# Patient Record
Sex: Male | Born: 1985 | Race: Black or African American | Hispanic: No | Marital: Married | Smoking: Current every day smoker
Health system: Southern US, Community
[De-identification: ages and names within clinical notes are randomized; demographics above are authoritative.]

## PROBLEM LIST (undated history)

## (undated) DIAGNOSIS — J45909 Unspecified asthma, uncomplicated: Secondary | ICD-10-CM

---

## 2007-08-06 ENCOUNTER — Other Ambulatory Visit: Payer: Self-pay

## 2007-08-06 ENCOUNTER — Emergency Department: Payer: Self-pay | Admitting: Emergency Medicine

## 2008-01-29 ENCOUNTER — Emergency Department: Payer: Self-pay | Admitting: Emergency Medicine

## 2008-02-06 ENCOUNTER — Other Ambulatory Visit: Payer: Self-pay

## 2008-02-06 ENCOUNTER — Emergency Department: Payer: Self-pay | Admitting: Emergency Medicine

## 2008-07-22 ENCOUNTER — Emergency Department: Payer: Self-pay | Admitting: Emergency Medicine

## 2008-12-04 ENCOUNTER — Emergency Department: Payer: Self-pay | Admitting: Emergency Medicine

## 2009-02-11 ENCOUNTER — Emergency Department: Payer: Self-pay | Admitting: Internal Medicine

## 2010-11-18 ENCOUNTER — Emergency Department: Payer: Self-pay | Admitting: Unknown Physician Specialty

## 2011-06-16 ENCOUNTER — Emergency Department: Payer: Self-pay | Admitting: Emergency Medicine

## 2012-04-10 ENCOUNTER — Emergency Department: Payer: Self-pay | Admitting: Emergency Medicine

## 2012-04-10 LAB — COMPREHENSIVE METABOLIC PANEL
Alkaline Phosphatase: 54 U/L (ref 50–136)
Bilirubin,Total: 0.5 mg/dL (ref 0.2–1.0)
Calcium, Total: 9.1 mg/dL (ref 8.5–10.1)
Chloride: 107 mmol/L (ref 98–107)
Co2: 25 mmol/L (ref 21–32)
Creatinine: 1.07 mg/dL (ref 0.60–1.30)
EGFR (African American): >60
EGFR (Non-African Amer.): >60
Potassium: 3.8 mmol/L (ref 3.5–5.1)
SGOT(AST): 34 U/L (ref 15–37)
SGPT (ALT): 35 U/L
Sodium: 141 mmol/L (ref 136–145)
Total Protein: 8.3 g/dL — ABNORMAL HIGH (ref 6.4–8.2)

## 2012-04-10 LAB — URINALYSIS, COMPLETE
Bilirubin,UR: NEGATIVE
Blood: NEGATIVE
Glucose,UR: NEGATIVE mg/dL (ref 0–75)
Nitrite: NEGATIVE
Ph: 7 (ref 4.5–8.0)
Specific Gravity: 1.03 (ref 1.003–1.030)
WBC UR: 1 /HPF (ref 0–5)

## 2012-04-10 LAB — CBC
HGB: 15 g/dL (ref 13.0–18.0)
MCV: 83 fL (ref 80–100)
Platelet: 196 10*3/uL (ref 150–440)
RBC: 5.62 10*6/uL (ref 4.40–5.90)
WBC: 10.2 10*3/uL (ref 3.8–10.6)

## 2012-07-04 ENCOUNTER — Emergency Department: Payer: Self-pay | Admitting: Emergency Medicine

## 2012-07-17 ENCOUNTER — Emergency Department: Payer: Self-pay | Admitting: Emergency Medicine

## 2013-02-14 ENCOUNTER — Emergency Department: Payer: Self-pay | Admitting: Emergency Medicine

## 2014-11-27 ENCOUNTER — Emergency Department: Payer: Self-pay | Admitting: Student

## 2015-02-14 ENCOUNTER — Emergency Department: Payer: Self-pay | Admitting: Emergency Medicine

## 2016-09-26 ENCOUNTER — Emergency Department
Admission: EM | Admit: 2016-09-26 | Discharge: 2016-09-26 | Disposition: A | Discharge status: Home / Self Care | Payer: Self-pay | Attending: Emergency Medicine | Admitting: Emergency Medicine

## 2016-09-26 ENCOUNTER — Encounter: Payer: Self-pay | Admitting: *Deleted

## 2016-09-26 DIAGNOSIS — X58XXXA Exposure to other specified factors, initial encounter: Secondary | ICD-10-CM | POA: Insufficient documentation

## 2016-09-26 DIAGNOSIS — Y999 Unspecified external cause status: Secondary | ICD-10-CM | POA: Insufficient documentation

## 2016-09-26 DIAGNOSIS — Y929 Unspecified place or not applicable: Secondary | ICD-10-CM | POA: Insufficient documentation

## 2016-09-26 DIAGNOSIS — S0501XA Injury of conjunctiva and corneal abrasion without foreign body, right eye, initial encounter: Secondary | ICD-10-CM | POA: Insufficient documentation

## 2016-09-26 DIAGNOSIS — F17210 Nicotine dependence, cigarettes, uncomplicated: Secondary | ICD-10-CM | POA: Insufficient documentation

## 2016-09-26 DIAGNOSIS — Y9389 Activity, other specified: Secondary | ICD-10-CM | POA: Insufficient documentation

## 2016-09-26 MED ORDER — NAPHAZOLINE-PHENIRAMINE 0.025-0.3 % OP SOLN: 1.0000 [drp] | Freq: Four times a day (QID) | OPHTHALMIC | 0 refills | Status: DC | PRN

## 2016-09-26 NOTE — ED Notes (Signed)
See triage note  States he developed some irritation and drainage to right eye yesterday  States he put a couple of drops of lemon juice in eye  Min redness noted at present no swelling

## 2016-09-26 NOTE — ED Provider Notes (Signed)
[facility] Emergency Department Provider Note   ____________________________________________   None    (approximate)  I have reviewed the triage vital signs and the nursing notes.   HISTORY  Chief Complaint Eye Drainage    HPI Henry Jackson is a 30 y.o. male patient complaining of drainage from right eye yesterday. Patient was told to go home by manager to get medical clearance for returning to work. Patient works in W[employer]. Patient state this morning he put a couple drops of limited juice in his eye because he was told this would decrease the irritation. Patient states still has some mild burning but no further tearing. Patient rates his pain as a 2/10. History reviewed. No pertinent past medical history.  There are no active problems to display for this patient.   History reviewed. No pertinent surgical history.  Prior to Admission medications   Medication Sig Start Date End Date Taking? Authorizing Provider  naphazoline-pheniramine (NAPHCON-A) 0.025-0.3 % ophthalmic solution Place 1 drop into both eyes 4 (four) times daily as needed for irritation. 09/26/16   Joni Reining, PA-C    Allergies Penicillins and Sulfa antibiotics  No family history on file.  Social History Social History  Substance Use Topics  . Smoking status: Current Every Day Smoker    Packs/day: 1.00    Types: Cigarettes  . Smokeless tobacco: Not on file  . Alcohol use No    Review of Systems Constitutional: No fever/chills Eyes: No visual changes. ENT: No sore throat. Cardiovascular: Denies chest pain. Respiratory: Denies shortness of breath. Gastrointestinal: No abdominal pain.  No nausea, no vomiting.  No diarrhea.  No constipation. Genitourinary: Negative for dysuria. Musculoskeletal: Negative for back pain. Skin: Negative for rash. Neurological: Negative for headaches, focal weakness or  numbness.    ____________________________________________   PHYSICAL EXAM:  VITAL SIGNS: ED Triage Vitals  Enc Vitals Group     BP 09/26/16 1410 (!) 166/100     Pulse Rate 09/26/16 1410 87     Resp 09/26/16 1410 20     Temp 09/26/16 1410 98.1 F (36.7 C)     Temp Source 09/26/16 1410 Oral     SpO2 09/26/16 1410 96 %     Weight 09/26/16 1405 (!) 304 lb (137.9 kg)     Height 09/26/16 1405 5\' 9"  (1.753 m)     Head Circumference --      Peak Flow --      Pain Score --      Pain Loc --      Pain Edu? --      Excl. in GC? --     Constitutional: Alert and oriented. Well appearing and in no acute distress. Eyes: Right conjunctiva is mildly erythematous.Marland Kitchen PERRL. EOMI. Head: Atraumatic. Nose: No congestion/rhinnorhea. Mouth/Throat: Mucous membranes are moist.  Oropharynx non-erythematous. Neck: No stridor.  No cervical spine tenderness to palpation. Hematological/Lymphatic/Immunilogical: No cervical lymphadenopathy. Cardiovascular: Normal rate, regular rhythm. Grossly normal heart sounds.  Good peripheral circulation. Respiratory: Normal respiratory effort.  No retractions. Lungs CTAB. Gastrointestinal: Soft and nontender. No distention. No abdominal bruits. No CVA tenderness. Musculoskeletal: No lower extremity tenderness nor edema.  No joint effusions. Neurologic:  Normal speech and language. No gross focal neurologic deficits are appreciated. No gait instability. Skin:  Skin is warm, dry and intact. No rash noted. Psychiatric: Mood and affect are normal. Speech and behavior are normal.  ____________________________________________   LABS (all labs ordered are listed, but only abnormal results are displayed)  Labs Reviewed - No data to display ____________________________________________  EKG   ____________________________________________  RADIOLOGY   ____________________________________________   PROCEDURES  Procedure(s) performed:  None  Procedures  Critical Care performed: No  ____________________________________________   INITIAL IMPRESSION / ASSESSMENT AND PLAN / ED COURSE  Pertinent labs & imaging results that were available during my care of the patient were reviewed by me and considered in my medical decision making (see chart for details).  Viral conjunctivitis. Patient given discharge Instructions. Patient given prescription for Naphcon eyedrops. Patient [facility]. Patient given a return to work note.  Clinical Course     ____________________________________________   FINAL CLINICAL IMPRESSION(S) / ED DIAGNOSES  Final diagnoses:  Conjunctival abrasion, right, initial encounter      NEW MEDICATIONS STARTED DURING THIS VISIT:  New Prescriptions   NAPHAZOLINE-PHENIRAMINE (NAPHCON-A) 0.025-0.3 % OPHTHALMIC SOLUTION    Place 1 drop into both eyes 4 (four) times daily as needed for irritation.     Note:  This document was prepared using Dragon voice recognition software and may include unintentional dictation errors.    Joni Reiningonald K Ramari Bray, PA-C 09/26/16 1446    Sharman CheekPhillip Stafford, MD 09/27/16 [phone]

## 2016-09-26 NOTE — ED Triage Notes (Signed)
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

## 2016-11-22 ENCOUNTER — Emergency Department (HOSPITAL_COMMUNITY)
Admission: EM | Admit: 2016-11-22 | Discharge: 2016-11-23 | Disposition: A | Discharge status: Home / Self Care | Payer: Self-pay | Attending: Emergency Medicine | Admitting: Emergency Medicine

## 2016-11-22 ENCOUNTER — Encounter (HOSPITAL_COMMUNITY): Payer: Self-pay | Admitting: *Deleted

## 2016-11-22 DIAGNOSIS — F17210 Nicotine dependence, cigarettes, uncomplicated: Secondary | ICD-10-CM | POA: Insufficient documentation

## 2016-11-22 DIAGNOSIS — J45909 Unspecified asthma, uncomplicated: Secondary | ICD-10-CM | POA: Insufficient documentation

## 2016-11-22 DIAGNOSIS — M5440 Lumbago with sciatica, unspecified side: Secondary | ICD-10-CM | POA: Insufficient documentation

## 2016-11-22 HISTORY — DX: Unspecified asthma, uncomplicated: J45.909

## 2016-11-22 MED ORDER — CYCLOBENZAPRINE HCL 10 MG PO TABS
10.0000 mg | ORAL_TABLET | Freq: Once | ORAL | Status: AC
  Administered 2016-11-22: 10 mg via ORAL
  Filled 2016-11-22: qty 1

## 2016-11-22 MED ORDER — IBUPROFEN 400 MG PO TABS
800.0000 mg | ORAL_TABLET | Freq: Once | ORAL | Status: AC
  Administered 2016-11-22: 800 mg via ORAL
  Filled 2016-11-22: qty 2

## 2016-11-22 NOTE — ED Triage Notes (Signed)
Pt c/o L sided low back pain x 1 week with increased pain x 2 days with radiation into leg. Denies injury to leg

## 2016-11-22 NOTE — ED Provider Notes (Signed)
[facility]-EMERGENCY DEPT Provider Note    By signing my name below, I, Earmon PhoenixJennifer Waddell, attest that this documentation has been prepared under the direction and in the presence of [facility] LP[facility] Neese, [place]. Electronically Signed: Earmon PhoenixJennifer Waddell, ED Scribe. 11/22/16. 11:50 PM.   History   Chief Complaint Chief Complaint  Patient presents with  . Back Pain   The history is provided by the patient and medical records. No language interpreter was used.    HPI Comments:  Henry Jackson is a morbidly obese 30 y.o. male who presents to the Emergency Department complaining of dull, aching low back pain for the past week that has been progressively worsening the last two days. Pt reports the pain radiates down his left leg to his shin. He reports a HA as well. He reports lifting heavy objects at work. He has [facility] with no significant relief of the pain. He has not taken anything for pain. He denies modifying factors. He denies numbness, tingling or weakness of the lower extremities, bowel or bladder incontinence, bruising, wounds, fever, chills, nausea, vomiting. He denies trauma, injury or fall.    Past Medical History:  Diagnosis Date  . Asthma     There are no active problems to display for this patient.   History reviewed. No pertinent surgical history.     Home Medications    Prior to Admission medications   Medication Sig Start Date End Date Taking? Authorizing Provider  cyclobenzaprine (FLEXERIL) 10 MG tablet Take 1 tablet (10 mg total) by mouth 2 (two) times daily as needed for muscle spasms. 11/23/16   Hope Orlene OchM Neese, NP  ibuprofen (ADVIL,MOTRIN) 600 MG tablet Take 1 tablet (600 mg total) by mouth every 6 (six) hours as needed. 11/23/16   Hope Orlene OchM Neese, NP  naphazoline-pheniramine (NAPHCON-A) 0.025-0.3 % ophthalmic solution Place 1 drop into both eyes 4 (four) times daily as needed for irritation. 09/26/16   Joni Reiningonald K Smith, PA-C    Family History No family  history on file.  Social History Social History  Substance Use Topics  . Smoking status: Current Every Day Smoker    Packs/day: 1.00    Types: Cigarettes  . Smokeless tobacco: Never Used  . Alcohol use Yes     Allergies   Penicillins and Sulfa antibiotics   Review of Systems Review of Systems  Constitutional: Negative for chills and fever.  Gastrointestinal: Negative for nausea and vomiting.  Genitourinary:       No bowel or bladder incontinence  Musculoskeletal: Positive for back pain.  Skin: Negative for color change and wound.  Neurological: Negative for weakness and numbness.  All other systems reviewed and are negative.    Physical Exam Updated Vital Signs BP 146/85 (BP Location: Left Arm)   Pulse 83   Temp 98.3 F (36.8 C) (Oral)   Resp 20   SpO2 95%   Physical Exam  Constitutional: He is oriented to person, place, and time. He appears well-developed and well-nourished. No distress.  HENT:  Head: Normocephalic and atraumatic.  Eyes: EOM are normal. Pupils are equal, round, and reactive to light.  Neck: Normal range of motion. Neck supple.  Cardiovascular: Normal rate and regular rhythm.   Pulmonary/Chest: Effort normal and breath sounds normal. No respiratory distress.  Abdominal: Soft. Bowel sounds are normal. There is no tenderness.  Musculoskeletal: Normal range of motion. He exhibits no edema.       Lumbar back: He exhibits tenderness and spasm. He exhibits  normal range of motion, no deformity and normal pulse.  Pain with palpation of left lumbar area that extends to the left sciatic nerve area.   Neurological: He is alert and oriented to person, place, and time. He has normal strength. No cranial nerve deficit or sensory deficit. Coordination and gait normal.  Reflex Scores:      Bicep reflexes are 2+ on the right side and 2+ on the left side.      Brachioradialis reflexes are 2+ on the right side and 2+ on the left side.      Patellar reflexes are 2+  on the right side and 2+ on the left side. Skin: Skin is warm and dry.  Psychiatric: He has a normal mood and affect. His behavior is normal.  Nursing note and vitals reviewed.    ED Treatments / Results  DIAGNOSTIC STUDIES: Oxygen Saturation is 95% on RA, adequate by my interpretation.   COORDINATION OF CARE: 10:50 PM- Will order Flexeril and Advil and have pt change into gown to better examine him. Pt verbalizes understanding and agrees to plan.  Medications  cyclobenzaprine (FLEXERIL) tablet 10 mg (10 mg Oral Given 11/22/16 2306)  ibuprofen (ADVIL,MOTRIN) tablet 800 mg (800 mg Oral Given 11/22/16 2306)    Labs (all labs ordered are listed, but only abnormal results are displayed) Labs Reviewed - No data to display   Radiology No results found.  Procedures Procedures (including critical care time)  Medications Ordered in ED Medications  cyclobenzaprine (FLEXERIL) tablet 10 mg (10 mg Oral Given 11/22/16 2306)  ibuprofen (ADVIL,MOTRIN) tablet 800 mg (800 mg Oral Given 11/22/16 2306)     Initial Impression / Assessment and Plan / ED Course  I have reviewed the triage vital signs and the nursing notes.  Clinical Course     Patient with back pain.  No neurological deficits and normal neuro exam.  Patient is ambulatory.  No loss of bowel or bladder control.  No concern for cauda equina.  No fever, night sweats, weight loss, h/o cancer, IVDA, no recent procedure to back. No urinary symptoms suggestive of UTI.  Supportive care and return precaution discussed. Appears safe for discharge at this time. Follow up as indicated in discharge paperwork.   I personally performed the services described in this documentation, which was scribed in my presence. The recorded information has been reviewed and is accurate.   Final Clinical Impressions(s) / ED Diagnoses   Final diagnoses:  Acute left-sided low back pain with sciatica, sciatica laterality unspecified    New  Prescriptions Discharge Medication List as of 11/23/2016 12:08 AM    START taking these medications   Details  cyclobenzaprine (FLEXERIL) 10 MG tablet Take 1 tablet (10 mg total) by mouth 2 (two) times daily as needed for muscle spasms., Starting Thu 11/23/2016, Print    ibuprofen (ADVIL,MOTRIN) 600 MG tablet Take 1 tablet (600 mg total) by mouth every 6 (six) hours as needed., Starting Thu 11/23/2016, Print         KalevaHope M Neese, NP 11/24/16 1727    Tomasita CrumbleAdeleke Oni, MD 11/27/16 1807

## 2016-11-23 MED ORDER — CYCLOBENZAPRINE HCL 10 MG PO TABS: 10.0000 mg | ORAL_TABLET | Freq: Two times a day (BID) | ORAL | 0 refills | Status: DC | PRN

## 2016-11-23 MED ORDER — IBUPROFEN 600 MG PO TABS: 600.0000 mg | ORAL_TABLET | Freq: Four times a day (QID) | ORAL | 0 refills | Status: DC | PRN

## 2018-05-06 ENCOUNTER — Other Ambulatory Visit: Payer: Self-pay

## 2018-05-06 ENCOUNTER — Emergency Department: Payer: Self-pay

## 2018-05-06 ENCOUNTER — Emergency Department
Admission: EM | Admit: 2018-05-06 | Discharge: 2018-05-06 | Disposition: A | Discharge status: Home / Self Care | Payer: Self-pay | Attending: Emergency Medicine | Admitting: Emergency Medicine

## 2018-05-06 DIAGNOSIS — J45909 Unspecified asthma, uncomplicated: Secondary | ICD-10-CM | POA: Insufficient documentation

## 2018-05-06 DIAGNOSIS — Z79899 Other long term (current) drug therapy: Secondary | ICD-10-CM | POA: Insufficient documentation

## 2018-05-06 DIAGNOSIS — F17210 Nicotine dependence, cigarettes, uncomplicated: Secondary | ICD-10-CM | POA: Insufficient documentation

## 2018-05-06 DIAGNOSIS — M5412 Radiculopathy, cervical region: Secondary | ICD-10-CM | POA: Insufficient documentation

## 2018-05-06 MED ORDER — METHYLPREDNISOLONE SODIUM SUCC 125 MG IJ SOLR
125.0000 mg | Freq: Once | INTRAMUSCULAR | Status: AC
  Administered 2018-05-06: 125 mg via INTRAMUSCULAR
  Filled 2018-05-06: qty 2

## 2018-05-06 MED ORDER — PREDNISONE 50 MG PO TABS: ORAL_TABLET | ORAL | 0 refills | Status: DC

## 2018-05-06 NOTE — ED Notes (Signed)
Pt to the er for pain to the right shoulder. Pt reports he was at work and had pain the shoulder and would get "hung up" with movement. Pain began over the weekend. Pain became worse at work. Pt can move his fingers but reports pain under the under arm and tingling while at work.

## 2018-05-06 NOTE — ED Triage Notes (Signed)
Pt in with co right shoulder pain, states has been having same pain for few years. States now pain is worse with some tingling to fingers.

## 2018-05-06 NOTE — ED Provider Notes (Signed)
[facility] Emergency Department Provider Note  ____________________________________________  Time seen: Approximately 11:28 PM  I have reviewed the triage vital signs and the nursing notes.   HISTORY  Chief Complaint Shoulder Pain    HPI Henry Jackson is a 32 y.o. male presents to the emergency department with radiculopathy of the right upper extremity.  Patient reports that he had an incident during work in which his right shoulder was stretched at the neck while putting a box together.  Patient reports that he experienced radiculopathy since that time.  He denies weakness or changes in sensation of the upper extremities.  Patient reports that he has chronic right shoulder pain.  He denies chest pain, chest tightness or shortness of breath.  No skin compromise.  He currently rates his pain at 8 out of 10 in intensity and describes it as like a burning.   Past Medical History:  Diagnosis Date  . Asthma     There are no active problems to display for this patient.   No past surgical history on file.  Prior to Admission medications   Medication Sig Start Date End Date Taking? Authorizing Provider  cyclobenzaprine (FLEXERIL) 10 MG tablet Take 1 tablet (10 mg total) by mouth 2 (two) times daily as needed for muscle spasms. 11/23/16   Janne Napoleon, NP  ibuprofen (ADVIL,MOTRIN) 600 MG tablet Take 1 tablet (600 mg total) by mouth every 6 (six) hours as needed. 11/23/16   Janne Napoleon, NP  naphazoline-pheniramine (NAPHCON-A) 0.025-0.3 % ophthalmic solution Place 1 drop into both eyes 4 (four) times daily as needed for irritation. 09/26/16   Joni Reining, PA-C  predniSONE (DELTASONE) 50 MG tablet Take one 50 mg tablet once daily for the next 5 days. 05/06/18   Orvil Feil, PA-C    Allergies Penicillins and Sulfa antibiotics  No family history on file.  Social History Social History   Tobacco Use  . Smoking status: Current Every Day Smoker   Packs/day: 1.00    Types: Cigarettes  . Smokeless tobacco: Never Used  Substance Use Topics  . Alcohol use: Yes  . Drug use: No     Review of Systems  Constitutional: No fever/chills Eyes: No visual changes. No discharge ENT: No upper respiratory complaints. Cardiovascular: no chest pain. Respiratory: no cough. No SOB. Gastrointestinal: No abdominal pain.  No nausea, no vomiting.  No diarrhea.  No constipation. Musculoskeletal: Patient has radiculopathy of the right upper extremity. Skin: Negative for rash, abrasions, lacerations, ecchymosis. Neurological: Negative for headaches, focal weakness or numbness.   ____________________________________________   PHYSICAL EXAM:  VITAL SIGNS: ED Triage Vitals [05/06/18 1957]  Enc Vitals Group     BP (!) 164/97     Pulse Rate 80     Resp 20     Temp 98.9 F (37.2 C)     Temp Source Oral     SpO2 97 %     Weight (!) 314 lb (142.4 kg)     Height  (1.702 m)     Head Circumference      Peak Flow      Pain Score 7     Pain Loc      Pain Edu?      Excl. in GC?      Constitutional: Alert and oriented. Well appearing and in no acute distress. Eyes: Conjunctivae are normal. PERRL. EOMI. Head: Atraumatic. Neck: Patient performs full range of motion without difficulty.  No tenderness was  elicited with palpation over the cervical spine. Cardiovascular: Normal rate, regular rhythm. Normal S1 and S2.  Good peripheral circulation. Respiratory: Normal respiratory effort without tachypnea or retractions. Lungs CTAB. Good air entry to the bases with no decreased or absent breath sounds. Gastrointestinal: Bowel sounds 4 quadrants. Soft and nontender to palpation. No guarding or rigidity. No palpable masses. No distention. No CVA tenderness. Musculoskeletal: Full range of motion to all extremities. Patient has no right rotator cuff weakness.  No AC separation appreciated with palpation. Neurologic:  Normal speech and language. No gross  focal neurologic deficits are appreciated.  Skin:  Skin is warm, dry and intact. No rash noted.  ____________________________________________   LABS (all labs ordered are listed, but only abnormal results are displayed)  Labs Reviewed - No data to display ____________________________________________  EKG   ____________________________________________  RADIOLOGY Henry Jackson, personally viewed and evaluated these images (plain radiographs) as part of my medical decision making, as well as reviewing the written report by the radiologist.  Dg Cervical Spine 2-3 Views  Result Date: 05/06/2018 CLINICAL DATA:  Pt in with co right shoulder pain, states has been having same pain for few years. States now pain is worse with some tingling to fingers. EXAM: CERVICAL SPINE - 2-3 VIEW COMPARISON:  None. FINDINGS: There is no evidence of cervical spine fracture or prevertebral soft tissue swelling. Alignment is normal. No other significant bone abnormalities are identified. IMPRESSION: Negative cervical spine radiographs. Electronically Signed   By: Bary Richard M.D.   On: 05/06/2018 20:58   Dg Shoulder Right  Result Date: 05/06/2018 CLINICAL DATA:  RIGHT shoulder pain for years, worsening with some tingling to fingers. EXAM: RIGHT SHOULDER - 2+ VIEW COMPARISON:  None. FINDINGS: There is no evidence of fracture or dislocation. There is no evidence of arthropathy or other focal bone abnormality. Soft tissues are unremarkable. IMPRESSION: Negative. Electronically Signed   By: Bary Richard M.D.   On: 05/06/2018 20:57    ____________________________________________    PROCEDURES  Procedure(s) performed:    Procedures    Medications  methylPREDNISolone sodium succinate (SOLU-MEDROL) 125 mg/2 mL injection 125 mg (125 mg Intramuscular Given 05/06/18 2201)     ____________________________________________   INITIAL IMPRESSION / ASSESSMENT AND PLAN / ED COURSE  Pertinent labs &  imaging results that were available during my care of the patient were reviewed by me and considered in my medical decision making (see chart for details).  Review of the  [place]RS was performed in accordance of the [facility] prior to dispensing any controlled drugs.     Assessment and Plan:  Cervical radiculopathy Differential diagnosis included cervical radiculopathy versus rotator cuff tear versus AC separation. Patient presents to the ED with radiculopathy of the right upper extremity.  X-ray examination of the cervical spine and right shoulder reveal no acute abnormalities.  Patient was given an injection of Solu-Medrol as patient reports that he cannot filll a prescription for prednisone immediately until payday.  He was discharged with prednisone.  All patient questions were answered.  Patient was advised to follow-up with primary care.    ____________________________________________  FINAL CLINICAL IMPRESSION(S) / ED DIAGNOSES  Final diagnoses:  Cervical radiculopathy      NEW MEDICATIONS STARTED DURING THIS VISIT:  ED Discharge Orders        Ordered    predniSONE (DELTASONE) 50 MG tablet     05/06/18 2153          This chart was dictated using voice recognition  software/Dragon. Despite best efforts to proofread, errors can occur which can change the meaning. Any change was purely unintentional.    Gasper Lloyd 05/06/18 2336    Sharman Cheek, MD 05/08/18 [phone]

## 2019-08-19 ENCOUNTER — Emergency Department
Admission: EM | Admit: 2019-08-19 | Discharge: 2019-08-19 | Disposition: A | Discharge status: Home / Self Care | Payer: Self-pay | Attending: Emergency Medicine | Admitting: Emergency Medicine

## 2019-08-19 ENCOUNTER — Other Ambulatory Visit: Payer: Self-pay

## 2019-08-19 DIAGNOSIS — S91209A Unspecified open wound of unspecified toe(s) with damage to nail, initial encounter: Secondary | ICD-10-CM

## 2019-08-19 DIAGNOSIS — Y939 Activity, unspecified: Secondary | ICD-10-CM | POA: Insufficient documentation

## 2019-08-19 DIAGNOSIS — J45909 Unspecified asthma, uncomplicated: Secondary | ICD-10-CM | POA: Insufficient documentation

## 2019-08-19 DIAGNOSIS — Y929 Unspecified place or not applicable: Secondary | ICD-10-CM | POA: Insufficient documentation

## 2019-08-19 DIAGNOSIS — Y999 Unspecified external cause status: Secondary | ICD-10-CM | POA: Insufficient documentation

## 2019-08-19 DIAGNOSIS — S91201A Unspecified open wound of right great toe with damage to nail, initial encounter: Secondary | ICD-10-CM | POA: Insufficient documentation

## 2019-08-19 DIAGNOSIS — W2209XA Striking against other stationary object, initial encounter: Secondary | ICD-10-CM | POA: Insufficient documentation

## 2019-08-19 DIAGNOSIS — F17210 Nicotine dependence, cigarettes, uncomplicated: Secondary | ICD-10-CM | POA: Insufficient documentation

## 2019-08-19 MED ORDER — OXYCODONE-ACETAMINOPHEN 7.5-325 MG PO TABS: 1.0000 | ORAL_TABLET | Freq: Four times a day (QID) | ORAL | 0 refills | Status: DC | PRN

## 2019-08-19 MED ORDER — LIDOCAINE HCL (PF) 1 % IJ SOLN
INTRAMUSCULAR | Status: AC
  Filled 2019-08-19: qty 5

## 2019-08-19 MED ORDER — LIDOCAINE HCL (PF) 1 % IJ SOLN
5.0000 mL | Freq: Once | INTRAMUSCULAR | Status: AC
  Administered 2019-08-19: 12:00:00 5 mL

## 2019-08-19 MED ORDER — OXYCODONE-ACETAMINOPHEN 5-325 MG PO TABS
1.0000 | ORAL_TABLET | Freq: Once | ORAL | Status: AC
  Administered 2019-08-19: 1 via ORAL
  Filled 2019-08-19: qty 1

## 2019-08-19 MED ORDER — LIDOCAINE HCL (PF) 1 % IJ SOLN
5.0000 mL | Freq: Once | INTRAMUSCULAR | Status: AC
  Administered 2019-08-19: 5 mL

## 2019-08-19 MED ORDER — BACITRACIN-NEOMYCIN-POLYMYXIN 400-5-5000 EX OINT
TOPICAL_OINTMENT | Freq: Once | CUTANEOUS | Status: AC
  Administered 2019-08-19: 1 via TOPICAL
  Filled 2019-08-19: qty 1

## 2019-08-19 MED ORDER — NEOSPORIN PLUS PAIN RELIEF MS 3.5-10000-10 EX CREA: TOPICAL_CREAM | Freq: Two times a day (BID) | CUTANEOUS | 0 refills | Status: DC

## 2019-08-19 MED ORDER — LIDOCAINE HCL (PF) 1 % IJ SOLN
INTRAMUSCULAR | Status: AC
  Administered 2019-08-19: 5 mL
  Filled 2019-08-19: qty 5

## 2019-08-19 NOTE — ED Provider Notes (Signed)
[facility] Emergency Department Provider Note   ____________________________________________   First MD Initiated Contact with Patient 08/19/19 1117     (approximate)  I have reviewed the triage vital signs and the nursing notes.   HISTORY  Chief Complaint Toe Injury    HPI Henry Jackson is a 33 y.o. male patient complain of right great partial toe nail avulsion secondary to being caught under bed frame last week.  Patient rates the pain as a 7/10.  Patient got up pain is "achy".  No palliative measure for complaint.         Past Medical History:  Diagnosis Date  . Asthma     There are no active problems to display for this patient.   No past surgical history on file.  Prior to Admission medications   Medication Sig Start Date End Date Taking? Authorizing Provider  neomycin-polymyxin-pramoxine (NEOSPORIN PLUS) 1 % cream Apply topically 2 (two) times daily. 08/19/19   Sable Feil, PA-C  oxyCODONE-acetaminophen (PERCOCET) 7.5-325 MG tablet Take 1 tablet by mouth every 6 (six) hours as needed. 08/19/19   Sable Feil, PA-C    Allergies Penicillins and Sulfa antibiotics  No family history on file.  Social History Social History   Tobacco Use  . Smoking status: Current Every Day Smoker    Packs/day: 1.00    Types: Cigarettes  . Smokeless tobacco: Never Used  Substance Use Topics  . Alcohol use: Yes  . Drug use: No    Review of Systems  Constitutional: No fever/chills Eyes: No visual changes. ENT: No sore throat. Cardiovascular: Denies chest pain. Respiratory: Denies shortness of breath. Gastrointestinal: No abdominal pain.  No nausea, no vomiting.  No diarrhea.  No constipation. Genitourinary: Negative for dysuria. Musculoskeletal: Negative for back pain. Skin: Negative for rash.  Partial toenail avulsion. Neurological: Negative for headaches, focal weakness or numbness. Allergic/Immunilogical: Penicillin and sulfur.  ____________________________________________   PHYSICAL EXAM:  VITAL SIGNS: ED Triage Vitals  Enc Vitals Group     BP      Pulse      Resp      Temp      Temp src      SpO2      Weight      Height      Head Circumference      Peak Flow      Pain Score      Pain Loc      Pain Edu?      Excl. in [place]?     Constitutional: Alert and oriented. Well appearing and in no acute distress. Cardiovascular: Normal rate, regular rhythm. Grossly normal heart sounds.  Good peripheral circulation. Respiratory: Normal respiratory effort.  No retractions. Lungs CTAB. Skin:  Skin is warm, dry and intact. No rash noted.  Partial toenail avulsion right great toe Psychiatric: Mood and affect are normal. Speech and behavior are normal.  ____________________________________________   LABS (all labs ordered are listed, but only abnormal results are displayed)  Labs Reviewed - No data to display ____________________________________________  EKG   ____________________________________________  RADIOLOGY  ED MD interpretation:    Official radiology report(s): No results found.  ____________________________________________   PROCEDURES  Procedure(s) performed (including Critical Care):  .Nail Removal  Date/Time: 08/19/2019 12:03 PM Performed by: Sable Feil, PA-C Authorized by: Sable Feil, PA-C   Consent:    Consent obtained:  Verbal   Consent given by:  Patient   Risks discussed:  Bleeding,  infection and pain Location:    Foot:  R big toe Pre-procedure details:    Skin preparation:  Hibiclens   Preparation: Patient was prepped and draped in the usual sterile fashion   Anesthesia (see MAR for exact dosages):    Anesthesia method:  Nerve block   Block needle gauge:  25 G   Block anesthetic:  Lidocaine 1% w/o epi   Block outcome:  Anesthesia achieved Nail Removal:    Nail removed:  Complete Post-procedure details:    Dressing:  Antibiotic ointment and  non-adhesive packing strip   Patient tolerance of procedure:  Tolerated well, no immediate complications     ____________________________________________   INITIAL IMPRESSION / ASSESSMENT AND PLAN / ED COURSE  As part of my medical decision making, I reviewed the following data within the electronic MEDICAL RECORD NUMBER         Henry Jackson was evaluated in Emergency Department on 08/19/2019 for the symptoms described in the history of present illness. He was evaluated in the context of the global COVID-19 pandemic, which necessitated consideration that the patient might be at risk for infection with the SARS-CoV-2 virus that causes COVID-19. Institutional protocols and algorithms that pertain to the evaluation of patients at risk for COVID-19 are in a state of rapid change based on information released by regulatory bodies including the CDC and federal and state organizations. These policies and algorithms were followed during the patient's care in the ED.  Patient presents with partial toenail avulsion for 1 week.  See procedure note for toenail removal.  Patient given discharge care instruction work note.  Patient advised to take medication as directed.      ____________________________________________   FINAL CLINICAL IMPRESSION(S) / ED DIAGNOSES  Final diagnoses:  Toenail avulsion, initial encounter     ED Discharge Orders         Ordered    neomycin-polymyxin-pramoxine (NEOSPORIN PLUS) 1 % cream  2 times daily     08/19/19 1215    oxyCODONE-acetaminophen (PERCOCET) 7.5-325 MG tablet  Every 6 hours PRN     08/19/19 1215           Note:  This document was prepared using Dragon voice recognition software and may include unintentional dictation errors.    Joni ReiningSmith, Ronald K, PA-C 08/19/19 1220    Chesley NoonJessup, Charles, MD 08/19/19 [phone]

## 2019-08-19 NOTE — ED Notes (Signed)
Pt alert and oriented X4, NAD noted  

## 2019-08-19 NOTE — ED Triage Notes (Addendum)
Pt arrives to ED with complaints that he got his left great toenail caught under a bed frame last week and complains that his toe nail is falling off.

## 2020-06-30 ENCOUNTER — Telehealth: Payer: Self-pay | Admitting: General Practice

## 2020-06-30 NOTE — Telephone Encounter (Signed)
Individual has been contacted 3+ times regarding ED referral. The calls could not be completed. No further attempts to contact individual will be made.

## 2020-09-21 ENCOUNTER — Emergency Department
Admission: EM | Admit: 2020-09-21 | Discharge: 2020-09-21 | Disposition: A | Discharge status: Home / Self Care | Payer: Self-pay | Attending: Emergency Medicine | Admitting: Emergency Medicine

## 2020-09-21 ENCOUNTER — Other Ambulatory Visit: Payer: Self-pay

## 2020-09-21 DIAGNOSIS — J45909 Unspecified asthma, uncomplicated: Secondary | ICD-10-CM | POA: Insufficient documentation

## 2020-09-21 DIAGNOSIS — L0291 Cutaneous abscess, unspecified: Secondary | ICD-10-CM

## 2020-09-21 DIAGNOSIS — L02415 Cutaneous abscess of right lower limb: Secondary | ICD-10-CM | POA: Insufficient documentation

## 2020-09-21 DIAGNOSIS — B379 Candidiasis, unspecified: Secondary | ICD-10-CM | POA: Insufficient documentation

## 2020-09-21 DIAGNOSIS — F17210 Nicotine dependence, cigarettes, uncomplicated: Secondary | ICD-10-CM | POA: Insufficient documentation

## 2020-09-21 MED ORDER — TOLNAFTATE 1 % EX AERP: INHALATION_SPRAY | Freq: Two times a day (BID) | CUTANEOUS | 1 refills | Status: DC

## 2020-09-21 MED ORDER — CLINDAMYCIN HCL 150 MG PO CAPS: 150.0000 mg | ORAL_CAPSULE | Freq: Four times a day (QID) | ORAL | 0 refills | Status: AC

## 2020-09-21 NOTE — ED Provider Notes (Signed)
[facility] Emergency Department Provider Note   ____________________________________________   First MD Initiated Contact with Patient 09/21/20 1106     (approximate)  I have reviewed the triage vital signs and the nursing notes.   HISTORY  Chief Complaint Abscess    HPI Henry Jackson is a 34 y.o. male patient complains of a nodular lesion to the right medial thigh for area.  Patient states the area has ruptured with mild purulent drainage.  Patient also has a rash between the skin folds of his obese abdomen.  Patient rates his pain as a 5/10.  Patient described the pain is "sore".  No palliative measure for complaint.         Past Medical History:  Diagnosis Date  . Asthma     There are no problems to display for this patient.   History reviewed. No pertinent surgical history.  Prior to Admission medications   Medication Sig Start Date End Date Taking? Authorizing Provider  clindamycin (CLEOCIN) 150 MG capsule Take 1 capsule (150 mg total) by mouth 4 (four) times daily for 10 days. 09/21/20 10/01/20  Joni Reining, PA-C  neomycin-polymyxin-pramoxine (NEOSPORIN PLUS) 1 % cream Apply topically 2 (two) times daily. 08/19/19   Joni Reining, PA-C  oxyCODONE-acetaminophen (PERCOCET) 7.5-325 MG tablet Take 1 tablet by mouth every 6 (six) hours as needed. 08/19/19   Joni Reining, PA-C  tolnaftate (TINACTIN) 1 % spray Apply topically 2 (two) times daily. 09/21/20   Joni Reining, PA-C    Allergies Iodinated diagnostic agents, Penicillins, Shellfish allergy, and Sulfa antibiotics  No family history on file.  Social History Social History   Tobacco Use  . Smoking status: Current Every Day Smoker    Packs/day: 1.00    Types: Cigarettes  . Smokeless tobacco: Never Used  Substance Use Topics  . Alcohol use: Yes  . Drug use: No    Review of Systems Constitutional: No fever/chills Eyes: No visual changes. ENT: No sore  throat. Cardiovascular: Denies chest pain. Respiratory: Denies shortness of breath. Gastrointestinal: No abdominal pain.  No nausea, no vomiting.  No diarrhea.  No constipation. Genitourinary: Negative for dysuria. Musculoskeletal: Negative for back pain. Skin: Positive for abscess and rash. Neurological: Negative for headaches, focal weakness or numbness. Allergic/Immunilogical: IVP dye, penicillin, shellfish, and sulfa antibiotics.  ____________________________________________   PHYSICAL EXAM:  VITAL SIGNS: ED Triage Vitals  Enc Vitals Group     BP 09/21/20 0801 140/82     Pulse Rate 09/21/20 0801 91     Resp 09/21/20 0801 18     Temp 09/21/20 0801 98.7 F (37.1 C)     Temp Source 09/21/20 0801 Oral     SpO2 09/21/20 0801 97 %     Weight 09/21/20 0802 (!) 326 lb (147.9 kg)     Height 09/21/20 0802 5\' 7"  (1.702 m)     Head Circumference --      Peak Flow --      Pain Score 09/21/20 0801 5     Pain Loc --      Pain Edu? --      Excl. in [place]? --    Constitutional: Alert and oriented. Well appearing and in no acute distress. Cardiovascular: Normal rate, regular rhythm. Grossly normal heart sounds.  Good peripheral circulation. Respiratory: Normal respiratory effort.  No retractions. Lungs CTAB. Musculoskeletal: No lower extremity [place] macular lesions nor edema.  No joint effusions. Neurologic:  Normal speech and language. No gross focal  neurologic deficits are appreciated. No gait instability. Skin: Ruptured papular lesion right medial thigh for area.  Erythematous moist macular lesions skin focal area of the lower abdominal area.   Psychiatric: Mood and affect are normal. Speech and behavior are normal.  ____________________________________________   LABS (all labs ordered are listed, but only abnormal results are displayed)  Labs Reviewed - No data to  display ____________________________________________  EKG   ____________________________________________  RADIOLOGY  ED MD interpretation:    Official radiology report(s): No results found.  ____________________________________________   PROCEDURES  Procedure(s) performed (including Critical Care):  Procedures   ____________________________________________   INITIAL IMPRESSION / ASSESSMENT AND PLAN / ED COURSE  As part of my medical decision making, I reviewed the following data within the electronic MEDICAL RECORD NUMBER     Patient presents with edema and erythema secondary to a ruptured abscess right medial thigh fold area.  Patient also has candidiasis between the skin folds of the lower abdomen.  Patient given discharge care instruction advised take medication as directed.  Patient [facility]    .      ____________________________________________   FINAL CLINICAL IMPRESSION(S) / ED DIAGNOSES  Final diagnoses:  Abscess  Candidiasis     ED Discharge Orders         Ordered    clindamycin (CLEOCIN) 150 MG capsule  4 times daily        09/21/20 1145    tolnaftate (TINACTIN) 1 % spray  2 times daily        09/21/20 1145          *Please note:  [name] was evaluated in Emergency Department on 09/21/2020 for the symptoms described in the history of present illness. He was evaluated in the context of the global COVID-19 pandemic, which necessitated consideration that the patient might be at risk for infection with the SARS-CoV-2 virus that causes COVID-19. Institutional protocols and algorithms that pertain to the evaluation of patients at risk for COVID-19 are in a state of rapid change based on information released by regulatory bodies including the CDC and federal and state organizations. These policies and algorithms were followed during the patient's care in the ED.  Some ED evaluations and interventions may be  delayed as a result of limited staffing during and the pandemic.*   Note:  This document was prepared using Dragon voice recognition software and may include unintentional dictation errors.    Joni Reining, PA-C 09/21/20 1153    Chesley Noon, MD 09/23/20 [phone]

## 2020-09-21 NOTE — ED Triage Notes (Signed)
Pt c/o "boil" to the right upper thigh since Saturday and has started to drain this morning.

## 2020-09-21 NOTE — Discharge Instructions (Signed)
Follow discharge care instruction take medication as directed. °

## 2020-09-21 NOTE — ED Notes (Signed)
Pt presents to the ED for an abscess that pt first noticed Saturday morning, pt states that it began to swelling more Saturday afternoon and is mildly painful, pt has a hx of abscesses and has need to get an abscess drained before. Pt believe the excessive walking he has to do and the material of his undershorts has irritated his abscess located on his R inner thigh

## 2021-03-17 ENCOUNTER — Emergency Department
Admission: EM | Admit: 2021-03-17 | Discharge: 2021-03-17 | Disposition: A | Discharge status: Home / Self Care | Payer: Self-pay | Attending: Emergency Medicine | Admitting: Emergency Medicine

## 2021-03-17 ENCOUNTER — Other Ambulatory Visit: Payer: Self-pay

## 2021-03-17 ENCOUNTER — Emergency Department: Payer: Self-pay

## 2021-03-17 DIAGNOSIS — R0600 Dyspnea, unspecified: Secondary | ICD-10-CM | POA: Insufficient documentation

## 2021-03-17 DIAGNOSIS — R059 Cough, unspecified: Secondary | ICD-10-CM | POA: Insufficient documentation

## 2021-03-17 DIAGNOSIS — J45909 Unspecified asthma, uncomplicated: Secondary | ICD-10-CM | POA: Insufficient documentation

## 2021-03-17 DIAGNOSIS — R739 Hyperglycemia, unspecified: Secondary | ICD-10-CM | POA: Insufficient documentation

## 2021-03-17 DIAGNOSIS — R0789 Other chest pain: Secondary | ICD-10-CM | POA: Insufficient documentation

## 2021-03-17 DIAGNOSIS — F17210 Nicotine dependence, cigarettes, uncomplicated: Secondary | ICD-10-CM | POA: Insufficient documentation

## 2021-03-17 LAB — CBC WITH DIFFERENTIAL/PLATELET
Abs Immature Granulocytes: 0.02 10*3/uL (ref 0.00–0.07)
Basophils Absolute: 0 10*3/uL (ref 0.0–0.1)
Basophils Relative: 0 %
Eosinophils Absolute: 0.1 10*3/uL (ref 0.0–0.5)
Eosinophils Relative: 1 %
HCT: 42.1 % (ref 39.0–52.0)
Hemoglobin: 13.5 g/dL (ref 13.0–17.0)
Immature Granulocytes: 0 %
Lymphocytes Relative: 27 %
Lymphs Abs: 2 10*3/uL (ref 0.7–4.0)
MCH: 26.4 pg (ref 26.0–34.0)
MCHC: 32.1 g/dL (ref 30.0–36.0)
MCV: 82.2 fL (ref 80.0–100.0)
Monocytes Absolute: 0.4 10*3/uL (ref 0.1–1.0)
Monocytes Relative: 5 %
Neutro Abs: 5 10*3/uL (ref 1.7–7.7)
Neutrophils Relative %: 67 %
Platelets: 201 10*3/uL (ref 150–400)
RBC: 5.12 MIL/uL (ref 4.22–5.81)
RDW: 14.1 % (ref 11.5–15.5)
WBC: 7.6 10*3/uL (ref 4.0–10.5)
nRBC: 0 % (ref 0.0–0.2)

## 2021-03-17 LAB — BASIC METABOLIC PANEL
Anion gap: 6 (ref 5–15)
BUN: 10 mg/dL (ref 6–20)
CO2: 25 mmol/L (ref 22–32)
Calcium: 8.5 mg/dL — ABNORMAL LOW (ref 8.9–10.3)
Chloride: 106 mmol/L (ref 98–111)
Creatinine, Ser: 0.85 mg/dL (ref 0.61–1.24)
GFR, Estimated: >60 mL/min (ref >60)
Glucose, Bld: 195 mg/dL — ABNORMAL HIGH (ref 70–99)
Potassium: 3.7 mmol/L (ref 3.5–5.1)
Sodium: 137 mmol/L (ref 135–145)

## 2021-03-17 LAB — TROPONIN I (HIGH SENSITIVITY): Troponin I (High Sensitivity): 5 ng/L (ref <18)

## 2021-03-17 MED ORDER — ALBUTEROL SULFATE HFA 108 (90 BASE) MCG/ACT IN AERS: 2.0000 | INHALATION_SPRAY | Freq: Four times a day (QID) | RESPIRATORY_TRACT | 2 refills | Status: AC | PRN

## 2021-03-17 NOTE — ED Provider Notes (Signed)
[facility] Emergency Department Provider Note  ____________________________________________   Event Date/Time   First MD Initiated Contact with Patient 03/17/21 0740     (approximate)  I have reviewed the triage vital signs and the nursing notes.   HISTORY  Chief Complaint Shortness of Breath   HPI Henry Jackson is a 35 y.o. male presents to the ED with complaint of shortness of breath this morning.  Patient states he has a history of asthma and is currently out of his inhaler.  Patient states that he has some tightness in his chest and discomfort which increases with inspiration.  Patient also has an occasional productive cough but no fever and denies chills.  Patient also denies any other symptoms associated with Covid.  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  He rates his pain as a 3 out of 10.       Past Medical History:  Diagnosis Date  . Asthma     There are no problems to display for this patient.   History reviewed. No pertinent surgical history.  Prior to Admission medications   Medication Sig Start Date End Date Taking? Authorizing Provider  albuterol (VENTOLIN HFA) 108 (90 Base) MCG/ACT inhaler Inhale 2 puffs into the lungs every 6 (six) hours as needed for wheezing or shortness of breath. 03/17/21  Yes Bridget Hartshorn L, PA-C  neomycin-polymyxin-pramoxine (NEOSPORIN PLUS) 1 % cream Apply topically 2 (two) times daily. 08/19/19   Joni Reining, PA-C  tolnaftate (TINACTIN) 1 % spray Apply topically 2 (two) times daily. 09/21/20   Joni Reining, PA-C    Allergies Iodinated diagnostic agents, Penicillins, Shellfish allergy, and Sulfa antibiotics  History reviewed. No pertinent family history.  Social History Social History   Tobacco Use  . Smoking status: Current Every Day Smoker    Packs/day: 1.00    Types: Cigarettes  . Smokeless tobacco:  Never Used  Substance Use Topics  . Alcohol use: Yes  . Drug use: No    Review of Systems Constitutional: No fever/chills Eyes: No visual changes. ENT: No sore throat. Cardiovascular: Denies chest pain. Respiratory: Positive shortness of breath.  Positive for occasional productive cough.  Positive for history of asthma. Gastrointestinal: No abdominal pain.  No nausea, no vomiting.  No diarrhea.  No constipation. Musculoskeletal: Negative for musculoskeletal pain. Skin: Negative for rash. Neurological: Negative for headaches, focal weakness or numbness.  ____________________________________________   PHYSICAL EXAM:  VITAL SIGNS: ED Triage Vitals  Enc Vitals Group     BP 03/17/21 0725 (!) 168/90     Pulse Rate 03/17/21 0725 67     Resp 03/17/21 0725 18     Temp 03/17/21 0725 98 F (36.7 C)     Temp Source 03/17/21 0725 Oral     SpO2 03/17/21 0725 99 %     Weight 03/17/21 0724 (!) 320 lb (145.2 kg)     Height 03/17/21 0724 5\' 7"  (1.702 m)     Head Circumference --      Peak Flow --      Pain Score 03/17/21 0723 3     Pain Loc --      Pain Edu? --      Excl. in [place]? --     Constitutional: Alert and oriented. Well appearing and in no acute distress. Eyes: Conjunctivae are normal.  Head: Atraumatic. Nose: No congestion/rhinnorhea. Mouth/Throat: Mucous membranes are moist.  Oropharynx non-erythematous. Neck: No stridor.   Cardiovascular: Normal rate, regular rhythm. Grossly normal heart sounds.  Good peripheral circulation. Respiratory: Normal respiratory effort.  No retractions. Lungs no wheezing noted on exam.  Patient is able to talk in complete sentences without any shortness of breath noted. Gastrointestinal: Soft and nontender. No distention.  Musculoskeletal: Moves upper and lower extremities without any difficulty.  Normal gait was noted. Neurologic:  Normal speech and language. No gross focal neurologic deficits are appreciated. No gait instability. Skin:  Skin  is warm, dry and intact. No rash noted. Psychiatric: Mood and affect are normal. Speech and behavior are normal.  ____________________________________________   LABS (all labs ordered are listed, but only abnormal results are displayed)  Labs Reviewed  BASIC METABOLIC PANEL - Abnormal; Notable for the following components:      Result Value   Glucose, Bld 195 (*)    Calcium 8.5 (*)    All other components within normal limits  CBC WITH DIFFERENTIAL/PLATELET  CBC WITH DIFFERENTIAL/PLATELET  TROPONIN I (HIGH SENSITIVITY)   ____________________________________________  EKG Normal sinus rhythm with ventricular rate of 78. EKG was reviewed by doctors on the major side of the ED. ____________________________________________  RADIOLOGY I, Tommi Rumps, personally viewed and evaluated these images (plain radiographs) as part of my medical decision making, as well as reviewing the written report by the radiologist.   Official radiology report(s): DG Chest 2 View  Result Date: 03/17/2021 CLINICAL DATA:  Dyspnea, asthma EXAM: CHEST - 2 VIEW COMPARISON:  None. FINDINGS: The heart size and mediastinal contours are within normal limits. Both lungs are clear. The visualized skeletal structures are unremarkable. IMPRESSION: No active cardiopulmonary disease. Electronically Signed   By: Helyn Numbers MD   On: 03/17/2021 08:20    ____________________________________________   PROCEDURES  Procedure(s) performed (including Critical Care):  Procedures   ____________________________________________   INITIAL IMPRESSION / ASSESSMENT AND PLAN / ED COURSE  As part of my medical decision making, I reviewed the following data within the electronic MEDICAL RECORD NUMBER Notes from prior ED visits and [place] Controlled Substance Database  35 year old male presents to the ED with complaint of shortness of breath that occurred specifically while he was at work today.  Patient states that he is out  of his albuterol inhaler and does have a history of asthma.  He also describes some tightness in his chest without history of cardiac issues in the past.  EKG and chest x-ray were unremarkable.  Troponin and lab work was benign with the exception of his glucose was which was elevated at 195.  Patient states that he has not eaten anything today.  He reports that his mother does have a history of diabetes but he himself has never been told that he is diabetic.  Because of his family history and elevated blood sugar he will make an appointment with [facility] to have a hemoglobin A1c and further evaluation of his glucose.  We discussed eliminating foods that are high in sugar at this time.  Patient was discharged with a prescription for albuterol inhaler.  He will return to the emergency department if any severe worsening of his symptoms.  Patient was ambulatory at the time of discharge and having no difficulty breathing.  ____________________________________________   FINAL CLINICAL IMPRESSION(S) / ED DIAGNOSES  Final diagnoses:  Hyperglycemia  Dyspnea, unspecified type     ED Discharge Orders         Ordered    albuterol (VENTOLIN HFA)  108 (90 Base) MCG/ACT inhaler  Every 6 hours PRN        03/17/21 1203          *Please note:  Dion Sibal was evaluated in Emergency Department on 03/17/2021 for the symptoms described in the history of present illness. He was evaluated in the context of the global COVID-19 pandemic, which necessitated consideration that the patient might be at risk for infection with the SARS-CoV-2 virus that causes COVID-19. Institutional protocols and algorithms that pertain to the evaluation of patients at risk for COVID-19 are in a state of rapid change based on information released by regulatory bodies including the CDC and federal and state organizations. These policies and algorithms were followed during the patient's care in the ED.  Some ED  evaluations and interventions may be delayed as a result of limited staffing during and the pandemic.*   Note:  This document was prepared using Dragon voice recognition software and may include unintentional dictation errors.    Tommi Rumps, PA-C 03/17/21 1211    Sharman Cheek, MD 03/18/21 [phone]

## 2021-03-17 NOTE — ED Triage Notes (Signed)
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

## 2021-03-17 NOTE — ED Notes (Signed)
See triage note  Presents with some SOB this am  States he has a hx of asthma and is currently out of his inhaler  But this am he also has been having some tightness to left side of chest  Discomfort increases with inspiration  Also has an occasional prod cough  No fever

## 2021-03-17 NOTE — Discharge Instructions (Addendum)
Call make an appointment with [facility] for further evaluation of your blood sugar.  Also read the information about diabetes and nutrition.  Begin eliminating beverages containing sugar, cookies, cake, and any food items high in corn syrup, cane sugar, brown sugar as this can cause elevation of your blood sugar as well.  Chest x-ray, EKG and lab work were normal with the exception of your blood sugar.  Return to the emergency department if any severe worsening of your symptoms such as difficulty breathing or continued or worsening shortness of breath.

## 2021-04-25 ENCOUNTER — Emergency Department
Admission: EM | Admit: 2021-04-25 | Discharge: 2021-04-25 | Disposition: A | Discharge status: Home / Self Care | Payer: Self-pay

## 2021-04-25 ENCOUNTER — Emergency Department
Admission: EM | Admit: 2021-04-25 | Discharge: 2021-04-25 | Disposition: A | Discharge status: Home / Self Care | Payer: Self-pay | Attending: Emergency Medicine | Admitting: Emergency Medicine

## 2021-04-25 ENCOUNTER — Encounter: Payer: Self-pay | Admitting: Emergency Medicine

## 2021-04-25 DIAGNOSIS — Z79899 Other long term (current) drug therapy: Secondary | ICD-10-CM | POA: Insufficient documentation

## 2021-04-25 DIAGNOSIS — F17210 Nicotine dependence, cigarettes, uncomplicated: Secondary | ICD-10-CM | POA: Insufficient documentation

## 2021-04-25 DIAGNOSIS — J45909 Unspecified asthma, uncomplicated: Secondary | ICD-10-CM | POA: Insufficient documentation

## 2021-04-25 DIAGNOSIS — H1033 Unspecified acute conjunctivitis, bilateral: Secondary | ICD-10-CM | POA: Insufficient documentation

## 2021-04-25 MED ORDER — ERYTHROMYCIN 5 MG/GM OP OINT: 1.0000 "application " | TOPICAL_OINTMENT | Freq: Every day | OPHTHALMIC | 0 refills | Status: DC

## 2021-04-25 NOTE — ED Triage Notes (Signed)
Pt c/o bilateral eye irritation and drainage x1 days as well as nasal congestion. Pt in need of evaluation and clearance note to return back to work tomorrow.

## 2021-04-25 NOTE — ED Notes (Signed)
Pt called no answer at this time.  

## 2021-04-25 NOTE — ED Notes (Signed)
Pt ambulatory to POV without difficulty. VSS. NAD. All questions and concerns addressed.  

## 2021-04-25 NOTE — ED Provider Notes (Signed)
[facility] Emergency Department Provider Note   ____________________________________________    I have reviewed the triage vital signs and the nursing notes.   HISTORY  Chief Complaint Eye Pain     HPI Henry Jackson is a 35 y.o. male who reports he woke up this morning with discharge from both of his eyes, he reports they feel slightly itchy/scratchy throughout the day.  He thought it might be allergies but his boss at work told him he needed to go get checked out.  Denies fevers or chills, no reports of foreign bodies.  No welding.  Past Medical History:  Diagnosis Date  . Asthma     There are no problems to display for this patient.   History reviewed. No pertinent surgical history.  Prior to Admission medications   Medication Sig Start Date End Date Taking? Authorizing Provider  erythromycin ophthalmic ointment Place 1 application into both eyes at bedtime. 04/25/21  Yes Jene Every, MD  albuterol (VENTOLIN HFA) 108 (90 Base) MCG/ACT inhaler Inhale 2 puffs into the lungs every 6 (six) hours as needed for wheezing or shortness of breath. 03/17/21   Tommi Rumps, PA-C  neomycin-polymyxin-pramoxine (NEOSPORIN PLUS) 1 % cream Apply topically 2 (two) times daily. 08/19/19   Joni Reining, PA-C  tolnaftate (TINACTIN) 1 % spray Apply topically 2 (two) times daily. 09/21/20   Joni Reining, PA-C     Allergies Iodinated diagnostic agents, Penicillins, Shellfish allergy, and Sulfa antibiotics  History reviewed. No pertinent family history.  Social History Social History   Tobacco Use  . Smoking status: Current Every Day Smoker    Packs/day: 1.00    Types: Cigarettes  . Smokeless tobacco: Never Used  Substance Use Topics  . Alcohol use: Yes  . Drug use: No    Review of Systems  Constitutional: No fever/chills  ENT: As above   Gastrointestinal: No abdominal pain.     Musculoskeletal: Negative for back pain. Skin: Negative  for rash.     ____________________________________________   PHYSICAL EXAM:  VITAL SIGNS: ED Triage Vitals  Enc Vitals Group     BP 04/25/21 1957 (!) 162/90     Pulse Rate 04/25/21 1957 90     Resp 04/25/21 1957 18     Temp 04/25/21 1957 98.7 F (37.1 C)     Temp Source 04/25/21 1957 Oral     SpO2 04/25/21 1957 97 %     Weight 04/25/21 1957 (!) 138.3 kg (305 lb)     Height 04/25/21 1957 1.702 m (5\' 7" )     Head Circumference --      Peak Flow --      Pain Score 04/25/21 2016 0     Pain Loc --      Pain Edu? --      Excl. in GC? --     Constitutional: Alert and oriented. No acute distress. Pleasant and interactive Eyes: Mildly erythematous conjunctiva, small amount of discharge bilaterally otherwise reassuring exam  Nose: No congestion/rhinnorhea. Mouth/Throat: Mucous membranes are moist.   Cardiovascular: Normal rate, regular rhythm.  Respiratory: Normal respiratory effort.  No retractions.  Musculoskeletal: No lower extremity tenderness nor edema.   Neurologic:  Normal speech and language. No gross focal neurologic deficits are appreciated.   Skin:  Skin is warm, dry and intact. No rash noted.   ____________________________________________   LABS (all labs ordered are listed, but only abnormal results are displayed)  Labs Reviewed - No data to  display ____________________________________________  EKG   ____________________________________________  RADIOLOGY  None ____________________________________________   PROCEDURES  Procedure(s) performed: No  Procedures   Critical Care performed: No ____________________________________________   INITIAL IMPRESSION / ASSESSMENT AND PLAN / ED COURSE  Pertinent labs & imaging results that were available during my care of the patient were reviewed by me and considered in my medical decision making (see chart for details).  Patient well-appearing and in no acute distress, symptoms consistent with  conjunctivitis, likely viral   ____________________________________________   FINAL CLINICAL IMPRESSION(S) / ED DIAGNOSES  Final diagnoses:  Acute conjunctivitis of both eyes, unspecified acute conjunctivitis type      NEW MEDICATIONS STARTED DURING THIS VISIT:  Discharge Medication List as of 04/25/2021  8:04 PM    START taking these medications   Details  erythromycin ophthalmic ointment Place 1 application into both eyes at bedtime., Starting Mon 04/25/2021, Normal         Note:  This document was prepared using Dragon voice recognition software and may include unintentional dictation errors.   Jene Every, MD 04/25/21 2026

## 2021-04-25 NOTE — ED Notes (Signed)
Pt left to go pick up wife and states he will have to come back

## 2021-05-18 ENCOUNTER — Encounter: Payer: Self-pay | Admitting: Emergency Medicine

## 2021-05-18 ENCOUNTER — Other Ambulatory Visit: Payer: Self-pay

## 2021-05-18 ENCOUNTER — Emergency Department: Payer: Self-pay

## 2021-05-18 DIAGNOSIS — X501XXA Overexertion from prolonged static or awkward postures, initial encounter: Secondary | ICD-10-CM | POA: Insufficient documentation

## 2021-05-18 DIAGNOSIS — J45909 Unspecified asthma, uncomplicated: Secondary | ICD-10-CM | POA: Insufficient documentation

## 2021-05-18 DIAGNOSIS — F17210 Nicotine dependence, cigarettes, uncomplicated: Secondary | ICD-10-CM | POA: Insufficient documentation

## 2021-05-18 DIAGNOSIS — Y9301 Activity, walking, marching and hiking: Secondary | ICD-10-CM | POA: Insufficient documentation

## 2021-05-18 DIAGNOSIS — Y92410 Unspecified street and highway as the place of occurrence of the external cause: Secondary | ICD-10-CM | POA: Insufficient documentation

## 2021-05-18 DIAGNOSIS — S93401A Sprain of unspecified ligament of right ankle, initial encounter: Secondary | ICD-10-CM | POA: Insufficient documentation
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
## 2021-05-19 ENCOUNTER — Emergency Department
Admission: EM | Admit: 2021-05-19 | Discharge: 2021-05-19 | Disposition: A | Discharge status: Home / Self Care | Payer: Self-pay | Attending: Emergency Medicine | Admitting: Emergency Medicine

## 2021-05-19 DIAGNOSIS — S93401A Sprain of unspecified ligament of right ankle, initial encounter: Secondary | ICD-10-CM
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
## 2021-05-19 NOTE — Discharge Instructions (Signed)
X-ray was negative he can follow-up with orthopedics. Take tylenol 1 g every 8 hours and ibuprofen 600 every 6 hours with food.

## 2021-07-15 ENCOUNTER — Emergency Department
Admission: EM | Admit: 2021-07-15 | Discharge: 2021-07-15 | Disposition: A | Discharge status: Home / Self Care | Payer: Self-pay | Attending: Student in an Organized Health Care Education/Training Program | Admitting: Student in an Organized Health Care Education/Training Program

## 2021-07-15 DIAGNOSIS — F17210 Nicotine dependence, cigarettes, uncomplicated: Secondary | ICD-10-CM | POA: Insufficient documentation

## 2021-07-15 DIAGNOSIS — L02411 Cutaneous abscess of right axilla: Secondary | ICD-10-CM | POA: Insufficient documentation

## 2021-07-15 DIAGNOSIS — J45909 Unspecified asthma, uncomplicated: Secondary | ICD-10-CM | POA: Insufficient documentation

## 2021-07-15 DIAGNOSIS — Z7952 Long term (current) use of systemic steroids: Secondary | ICD-10-CM | POA: Insufficient documentation

## 2021-07-15 DIAGNOSIS — L0291 Cutaneous abscess, unspecified: Secondary | ICD-10-CM

## 2021-07-15 MED ORDER — PROBIOTIC 250 MG PO CAPS: 1.0000 | ORAL_CAPSULE | Freq: Two times a day (BID) | ORAL | 0 refills | Status: AC

## 2021-07-15 MED ORDER — LIDOCAINE HCL (PF) 1 % IJ SOLN
INTRAMUSCULAR | Status: AC
  Administered 2021-07-15: 5 mL via INTRADERMAL
  Filled 2021-07-15: qty 5

## 2021-07-15 MED ORDER — LIDOCAINE HCL (PF) 1 % IJ SOLN: 5.0000 mL | Freq: Once | INTRAMUSCULAR | Status: AC

## 2021-07-15 MED ORDER — CLINDAMYCIN HCL 300 MG PO CAPS: 300.0000 mg | ORAL_CAPSULE | Freq: Three times a day (TID) | ORAL | 0 refills | Status: AC

## 2021-07-15 MED ORDER — IBUPROFEN 600 MG PO TABS
600.0000 mg | ORAL_TABLET | Freq: Once | ORAL | Status: AC
  Administered 2021-07-15: 600 mg via ORAL
  Filled 2021-07-15: qty 1

## 2021-07-15 NOTE — ED Triage Notes (Signed)
Pt has an abscess and a rash under his right armpit. He noticed it yesterday and states that it has gotten bigger since yesterday.

## 2021-07-15 NOTE — ED Notes (Signed)
Pt assessed by provider prior to d/c.  

## 2021-07-15 NOTE — ED Provider Notes (Signed)
[facility] Emergency Department Provider Note    Event Date/Time   First MD Initiated Contact with Patient 07/15/21 1723     (approximate)  I have reviewed the triage vital signs and the nursing notes.   HISTORY  Chief Complaint Abscess    HPI Henry Jackson is a 35 y.o. male with no significant past medical history presents to the ER for evaluation of concern for boil developing under his right armpit over the past few days.  Reported mild discomfort.  Does have a history of boils in his groin requiring I&D.  No fevers.  No nausea or vomiting.  Reports pcn and sulfa allergy.  Past Medical History:  Diagnosis Date   Asthma    No family history on file. No past surgical history on file. There are no problems to display for this patient.     Prior to Admission medications   Medication Sig Start Date End Date Taking? Authorizing Provider  clindamycin (CLEOCIN) 300 MG capsule Take 1 capsule (300 mg total) by mouth 3 (three) times daily for 5 days. 07/15/21 07/20/21 Yes Willy Eddy, MD  Saccharomyces boulardii (PROBIOTIC) 250 MG CAPS Take 1 capsule by mouth in the morning and at bedtime. 07/15/21  Yes Willy Eddy, MD  albuterol (VENTOLIN HFA) 108 (90 Base) MCG/ACT inhaler Inhale 2 puffs into the lungs every 6 (six) hours as needed for wheezing or shortness of breath. 03/17/21   Bridget Hartshorn L, PA-C  erythromycin ophthalmic ointment Place 1 application into both eyes at bedtime. 04/25/21   Jene Every, MD  neomycin-polymyxin-pramoxine (NEOSPORIN PLUS) 1 % cream Apply topically 2 (two) times daily. 08/19/19   Joni Reining, PA-C  tolnaftate (TINACTIN) 1 % spray Apply topically 2 (two) times daily. 09/21/20   Joni Reining, PA-C    Allergies Iodinated diagnostic agents, Penicillins, Shellfish allergy, and Sulfa antibiotics    Social History Social History   Tobacco Use   Smoking status: Every Day    Packs/day: 1.00    Types:  Cigarettes   Smokeless tobacco: Never  Substance Use Topics   Alcohol use: Yes   Drug use: No    Review of Systems Patient denies headaches, rhinorrhea, blurry vision, numbness, shortness of breath, chest pain, edema, cough, abdominal pain, nausea, vomiting, diarrhea, dysuria, fevers, rashes or hallucinations unless otherwise stated above in HPI. ____________________________________________   PHYSICAL EXAM:  VITAL SIGNS: Vitals:   07/15/21 1543  BP: (!) 143/89  Pulse: 87  Resp: 20  Temp: 98.8 F (37.1 C)  SpO2: 98%    Constitutional: Alert and oriented. Well appearing and in no acute distress. Eyes: Conjunctivae are normal.  Head: Atraumatic. Nose: No congestion/rhinnorhea. Mouth/Throat: Mucous membranes are moist.   Neck: Painless ROM.  Cardiovascular:   Good peripheral circulation. Respiratory: Normal respiratory effort.  No retractions.  Gastrointestinal: Soft and nontender.  Musculoskeletal: No lower extremity tenderness .  No joint effusions. Neurologic:  Normal speech and language. No gross focal neurologic deficits are appreciated.  Skin:  Skin is warm, dry and intact. No rash noted.  1 cm x 2 cm area of fluctuance and tenderness in the right axilla no overlying warmth erythema or crepitus Psychiatric: Mood and affect are normal. Speech and behavior are normal.  ____________________________________________   LABS (all labs ordered are listed, but only abnormal results are displayed)  No results found for this or any previous visit (from the past 24 hour(s)). ____________________________________________ ____________________________________________  RADIOLOGY  EMERGENCY DEPARTMENT US SOFT TISSUE INTERPRETATION "  Study: Limited Soft Tissue Ultrasound"  INDICATIONS: Pain and Soft tissue infection Multiple views of the body part were obtained in real-time with a multi-frequency linear probe  PERFORMED BY: Myself IMAGES ARCHIVED?: No SIDE:Right  BODY  PART:Axilla INTERPRETATION:  Abcess present   ____________________________________________   PROCEDURES  Procedure(s) performed:  Procedures    Critical Care performed: no ____________________________________________   INITIAL IMPRESSION / ASSESSMENT AND PLAN / ED COURSE  Pertinent labs & imaging results that were available during my care of the patient were reviewed by me and considered in my medical decision making (see chart for details).   DDX: abscess,cellulitis, lymphadenitis, furuncle  Henry Jackson is a 35 y.o. who presents to the ED with 1cm abscess for last 2 days. No systemic symptoms, no fevers. VSS. Exam with small amount of surrounding erythema, c/w cellulitis. Clinical picture is not consistent with necrotizing fasc or osteomyelitis.  I & D of abscess done w/o complications. Area explored, drained as above.   Start ABX for small area of cellulitis surrounding abscess. Plan f/u for wound recheck in 2 days.  Patient ambulated without distress and tolerated PO. Patient stable for DC home with plan for close PCP follow up.       ____________________________________________   FINAL CLINICAL IMPRESSION(S) / ED DIAGNOSES  Final diagnoses:  Abscess      NEW MEDICATIONS STARTED DURING THIS VISIT:  New Prescriptions   CLINDAMYCIN (CLEOCIN) 300 MG CAPSULE    Take 1 capsule (300 mg total) by mouth 3 (three) times daily for 5 days.   SACCHAROMYCES BOULARDII (PROBIOTIC) 250 MG CAPS    Take 1 capsule by mouth in the morning and at bedtime.     Note:  This document was prepared using Dragon voice recognition software and may include unintentional dictation errors.     Willy Eddy, MD 07/15/21 [phone]

## 2021-08-02 ENCOUNTER — Other Ambulatory Visit: Payer: Self-pay

## 2021-08-02 ENCOUNTER — Emergency Department: Payer: Self-pay

## 2021-08-02 ENCOUNTER — Encounter: Payer: Self-pay | Admitting: Emergency Medicine

## 2021-08-02 ENCOUNTER — Emergency Department
Admission: EM | Admit: 2021-08-02 | Discharge: 2021-08-02 | Disposition: A | Discharge status: Home / Self Care | Payer: Self-pay | Attending: Emergency Medicine | Admitting: Emergency Medicine

## 2021-08-02 DIAGNOSIS — X58XXXA Exposure to other specified factors, initial encounter: Secondary | ICD-10-CM | POA: Insufficient documentation

## 2021-08-02 DIAGNOSIS — S83412A Sprain of medial collateral ligament of left knee, initial encounter: Secondary | ICD-10-CM | POA: Insufficient documentation

## 2021-08-02 DIAGNOSIS — F17210 Nicotine dependence, cigarettes, uncomplicated: Secondary | ICD-10-CM | POA: Insufficient documentation

## 2021-08-02 DIAGNOSIS — J45909 Unspecified asthma, uncomplicated: Secondary | ICD-10-CM | POA: Insufficient documentation

## 2021-08-02 NOTE — ED Provider Notes (Signed)
[facility] Emergency Department Provider Note ____________________________________________  Time seen: 0835  I have reviewed the triage vital signs and the nursing notes.  HISTORY  Chief Complaint  Knee Pain   HPI Henry Jackson is a 35 y.o. male presents himself to the ED for evaluation of intermittent left knee pain.  Patient denies any recent injury, trauma, fall but he does report some weakness in the knee, causing it to buckle but denies any outright injury.  He also denies any catch, click, lock, or effusion.  Past Medical History:  Diagnosis Date   Asthma     There are no problems to display for this patient.   History reviewed. No pertinent surgical history.  Prior to Admission medications   Medication Sig Start Date End Date Taking? Authorizing Provider  albuterol (VENTOLIN HFA) 108 (90 Base) MCG/ACT inhaler Inhale 2 puffs into the lungs every 6 (six) hours as needed for wheezing or shortness of breath. 03/17/21   Tommi Rumps, PA-C  Saccharomyces boulardii (PROBIOTIC) 250 MG CAPS Take 1 capsule by mouth in the morning and at bedtime. 07/15/21   Willy Eddy, MD    Allergies Iodinated diagnostic agents, Penicillins, Shellfish allergy, and Sulfa antibiotics  History reviewed. No pertinent family history.  Social History Social History   Tobacco Use   Smoking status: Every Day    Packs/day: 1.00    Types: Cigarettes   Smokeless tobacco: Never  Substance Use Topics   Alcohol use: Yes   Drug use: No    Review of Systems  Constitutional: Negative for fever. Cardiovascular: Negative for chest pain. Respiratory: Negative for shortness of breath. Gastrointestinal: Negative for abdominal pain, vomiting and diarrhea. Genitourinary: Negative for dysuria. Musculoskeletal: Negative for back pain.  Left knee pain as above. Skin: Negative for rash. Neurological: Negative for headaches, focal weakness or  numbness. ____________________________________________  PHYSICAL EXAM:  VITAL SIGNS: ED Triage Vitals  Enc Vitals Group     BP 08/02/21 0802 (!) 160/99     Pulse Rate 08/02/21 0802 74     Resp 08/02/21 0802 20     Temp 08/02/21 0802 97.8 F (36.6 C)     Temp src --      SpO2 08/02/21 0802 100 %     Weight 08/02/21 0759 (!) 309 lb 15.5 oz (140.6 kg)     Height 08/02/21 0759 5\' 7"  (1.702 m)     Head Circumference --      Peak Flow --      Pain Score 08/02/21 0759 5     Pain Loc --      Pain Edu? --      Excl. in GC? --     Constitutional: Alert and oriented. Well appearing and in no distress. Head: Normocephalic and atraumatic. Eyes: Conjunctivae are normal. PERRL. Normal extraocular movements Ears: Canals clear. TMs intact bilaterally. Nose: No congestion/rhinorrhea/epistaxis. Mouth/Throat: Mucous membranes are moist. Neck: Supple. No thyromegaly. Hematological/Lymphatic/Immunological: No cervical lymphadenopathy. Cardiovascular: Normal rate, regular rhythm. Normal distal pulses. Respiratory: Normal respiratory effort. No wheezes/rales/rhonchi. Gastrointestinal: Soft and nontender. No distention. Musculoskeletal: Nontender with normal range of motion in all extremities.  Neurologic:  Normal gait without ataxia. Normal speech and language. No gross focal neurologic deficits are appreciated. Skin:  Skin is warm, dry and intact. No rash noted. Psychiatric: Mood and affect are normal. Patient exhibits appropriate insight and judgment. ____________________________________________    {LABS (pertinent positives/negatives)  ____________________________________________  {EKG  ____________________________________________   RADIOLOGY Official radiology report(s): DG Knee  Complete 4 Views Left  Result Date: 08/02/2021 CLINICAL DATA:  Medial knee pain EXAM: LEFT KNEE - COMPLETE 4+ VIEW COMPARISON:  07/22/2008. FINDINGS: No evidence of fracture, dislocation, or joint effusion.  No evidence of arthropathy or other focal bone abnormality. Soft tissues are unremarkable. IMPRESSION: No significant osseous abnormality. Electronically Signed   By: Wiliam Ke MD   On: 08/02/2021 09:34   ____________________________________________  PROCEDURES   Procedures ____________________________________________   INITIAL IMPRESSION / ASSESSMENT AND PLAN / ED COURSE  As part of my medical decision making, I reviewed the following data within the electronic MEDICAL RECORD NUMBER Radiograph reviewed WNL and Notes from prior ED visits    DDX: OA, knee sprain, knee strain    ED evaluation of intermittent left knee pain after he experienced an injury.  Exam is overall benign and reassuring as high-grade as it shows no acute internal derangement.  No radiologic evidence of acute fracture or dislocation.  Clinically patient stable for outpatient follow-up with orthopedics at this time.  He is referred to Ortho follow-up as discussed.  Turn precautions been reviewed.  Kassidy Frankson was evaluated in Emergency Department on 08/02/2021 for the symptoms described in the history of present illness. He was evaluated in the context of the global COVID-19 pandemic, which necessitated consideration that the patient might be at risk for infection with the SARS-CoV-2 virus that causes COVID-19. Institutional protocols and algorithms that pertain to the evaluation of patients at risk for COVID-19 are in a state of rapid change based on information released by regulatory bodies including the CDC and federal and state organizations. These policies and algorithms were followed during the patient's care in the ED.  ____________________________________________  FINAL CLINICAL IMPRESSION(S) / ED DIAGNOSES  Final diagnoses:  Sprain of medial collateral ligament of left knee, initial encounter      Lissa Hoard, PA-C 08/02/21 1713    Shaune Pollack, MD 08/03/21 [phone]

## 2021-08-02 NOTE — Discharge Instructions (Addendum)
Your exam and XR are normal and reassuring. You do not have any signs of fracture or dislocation. You may have a mild sprain to the knee joint. Consider a knee sleeve for work. Take OTC ibuprofen for pain and inflammation.

## 2021-08-02 NOTE — ED Triage Notes (Signed)
C/O left knee buckling and pain also left ankle pain.  Denies recent injury.  Describes remote injuries to joints when a child.

## 2021-08-02 NOTE — ED Notes (Signed)
See triage note  Presents with discomfort to lateral aspect of left knee  Denies any falls but states his knee has been buckling

## 2021-08-09 IMAGING — CR DG KNEE COMPLETE 4+V*L*
4 series · 4 of 4 positions shown · non-contrast
Comparison: 07/22/2008.

CLINICAL DATA: Medial knee pain

EXAM:
LEFT KNEE - COMPLETE 4+ VIEW

[knee ap]
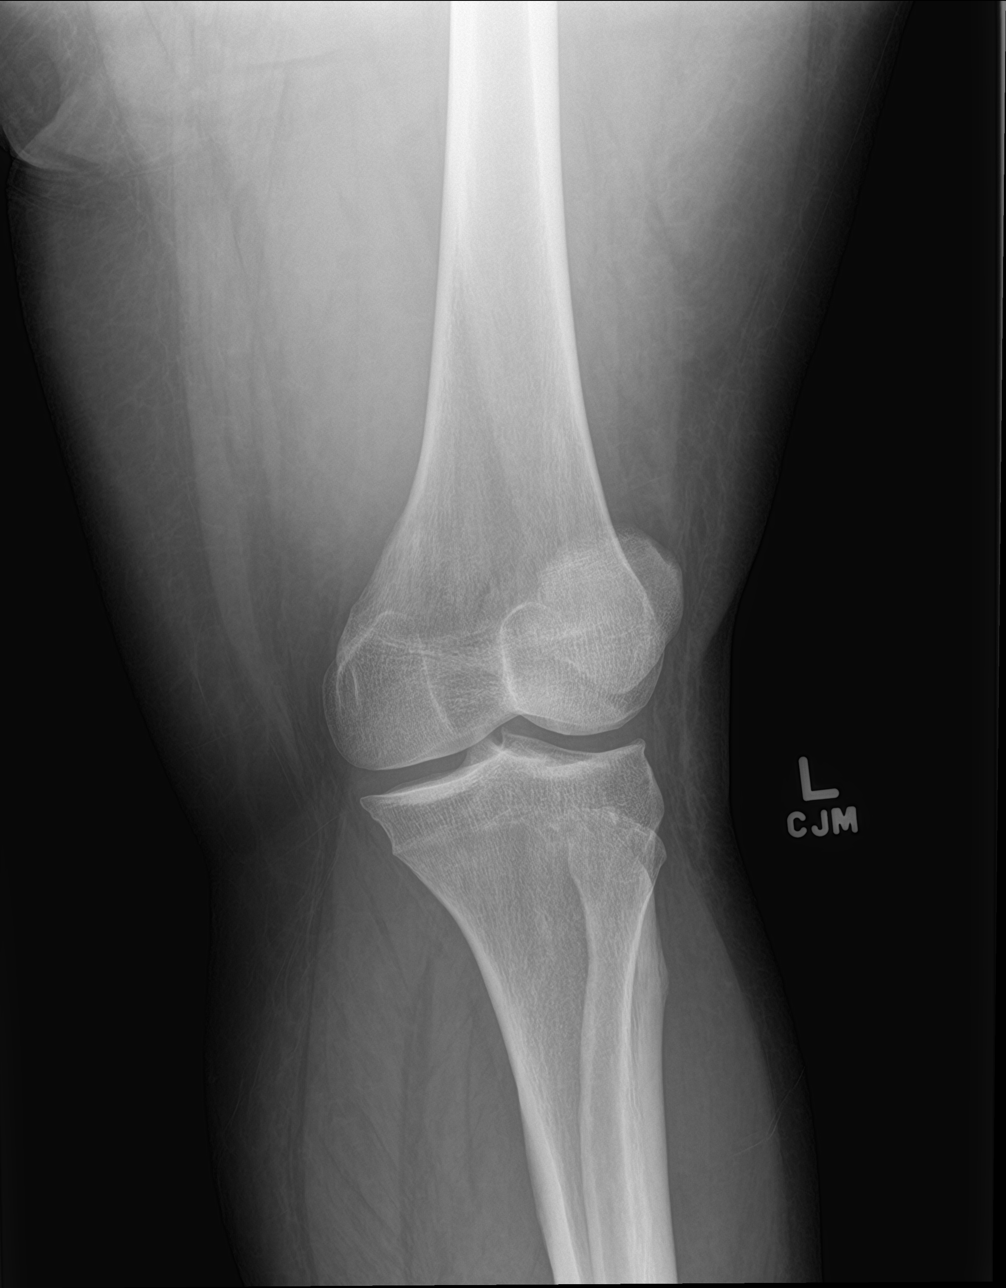

[knee obl (1 of 2)]
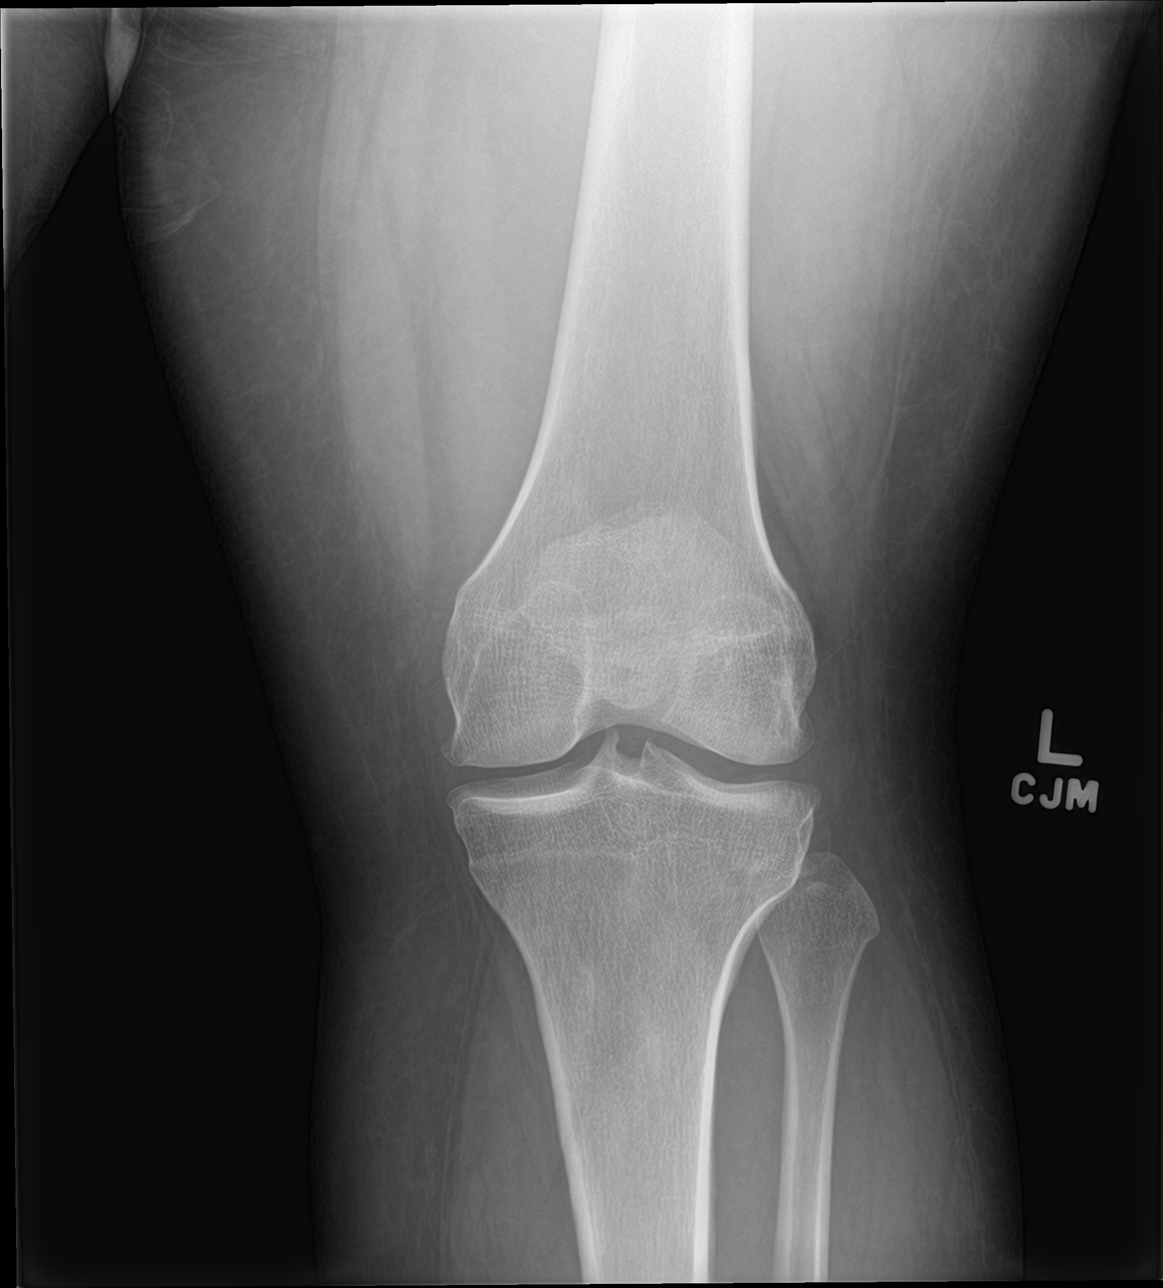

[knee obl (2 of 2)]
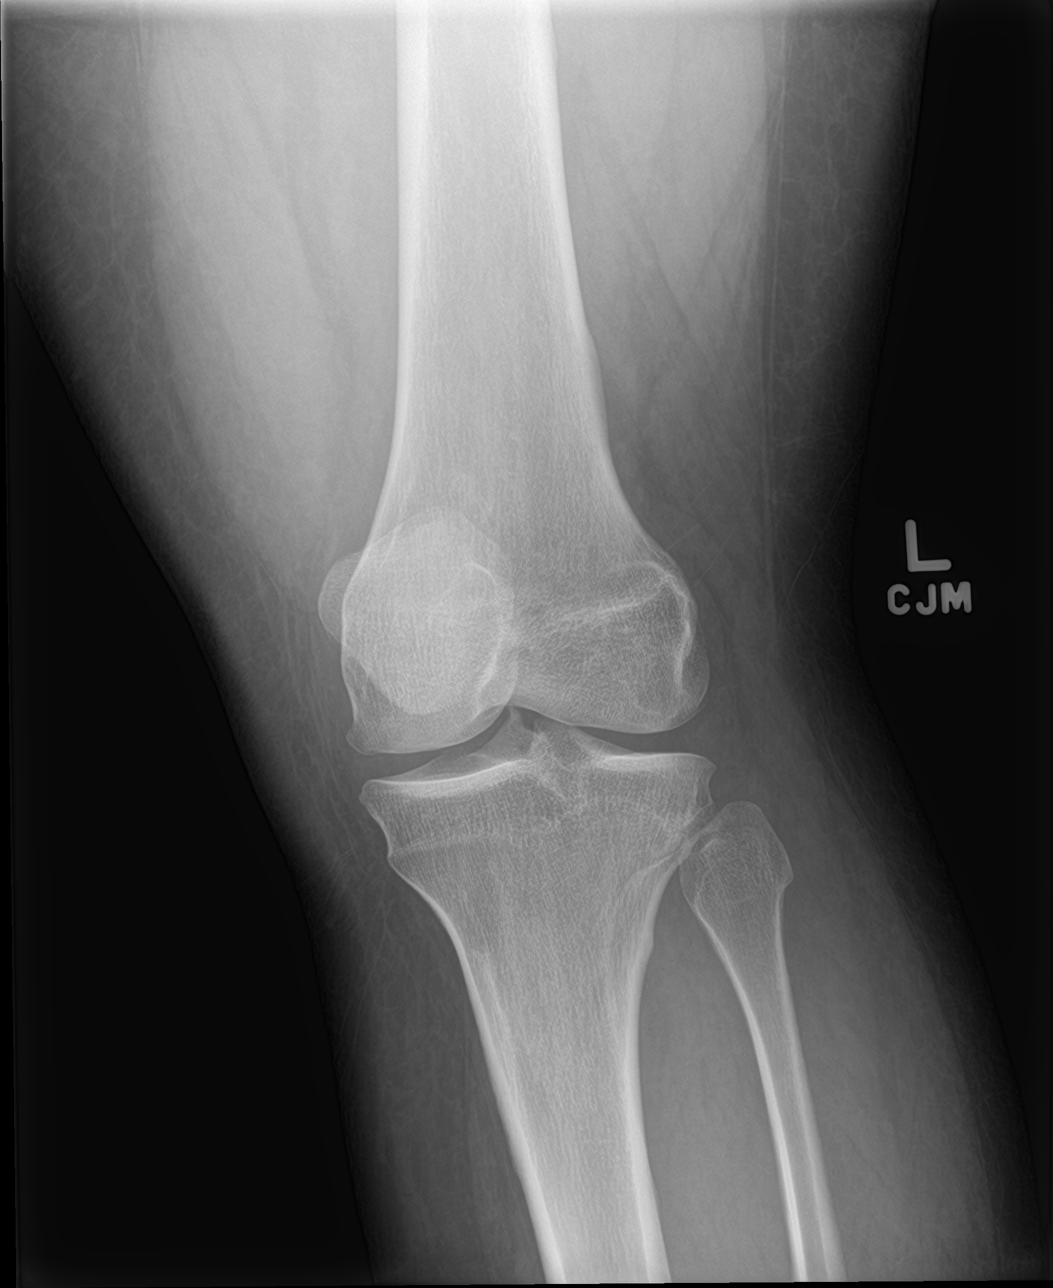

[knee lat]
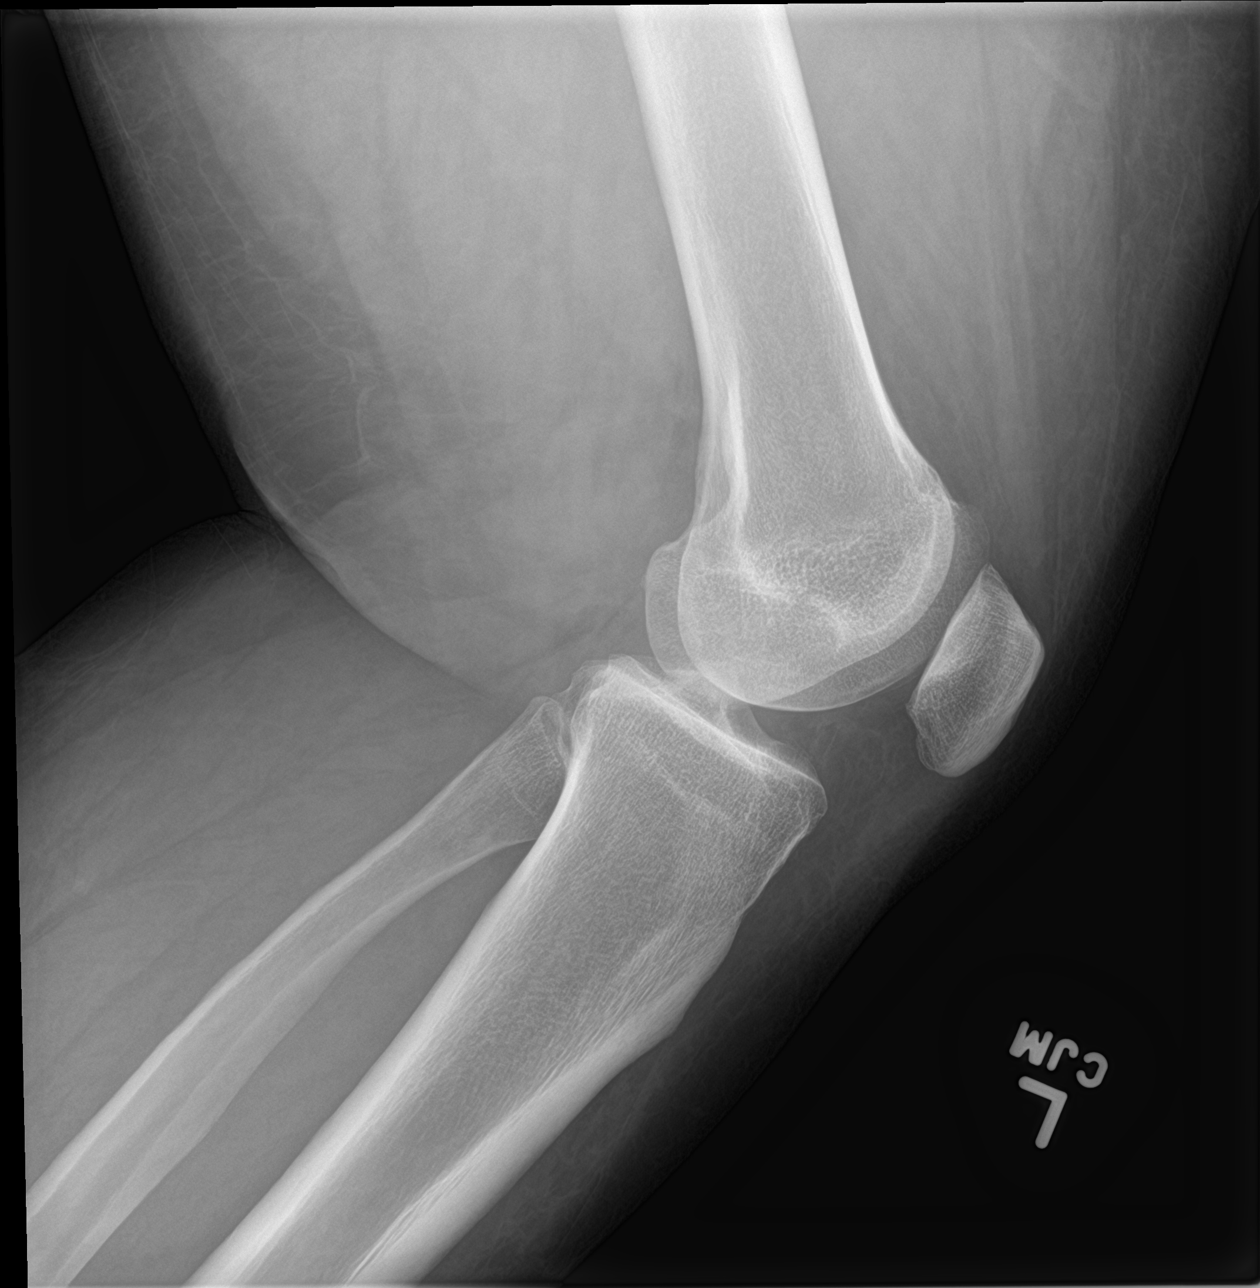

[4 of 4 positions shown; findings below may reference images not displayed]

FINDINGS: No evidence of fracture, dislocation, or joint effusion. No evidence
of arthropathy or other focal bone abnormality. Soft tissues are
unremarkable.
IMPRESSION: No significant osseous abnormality.

## 2021-08-17 ENCOUNTER — Encounter: Payer: Self-pay | Admitting: Emergency Medicine

## 2021-08-17 ENCOUNTER — Emergency Department: Payer: Self-pay

## 2021-08-17 ENCOUNTER — Emergency Department
Admission: EM | Admit: 2021-08-17 | Discharge: 2021-08-17 | Disposition: A | Discharge status: Home / Self Care | Payer: Self-pay | Attending: Emergency Medicine | Admitting: Emergency Medicine

## 2021-08-17 ENCOUNTER — Other Ambulatory Visit: Payer: Self-pay

## 2021-08-17 DIAGNOSIS — R519 Headache, unspecified: Secondary | ICD-10-CM | POA: Insufficient documentation

## 2021-08-17 DIAGNOSIS — J45909 Unspecified asthma, uncomplicated: Secondary | ICD-10-CM | POA: Insufficient documentation

## 2021-08-17 DIAGNOSIS — M21612 Bunion of left foot: Secondary | ICD-10-CM | POA: Insufficient documentation

## 2021-08-17 DIAGNOSIS — M79672 Pain in left foot: Secondary | ICD-10-CM

## 2021-08-17 DIAGNOSIS — F17210 Nicotine dependence, cigarettes, uncomplicated: Secondary | ICD-10-CM | POA: Insufficient documentation

## 2021-08-17 MED ORDER — NAPROXEN 500 MG PO TABS: 500.0000 mg | ORAL_TABLET | Freq: Two times a day (BID) | ORAL | 0 refills | Status: AC

## 2021-08-17 MED ORDER — KETOROLAC TROMETHAMINE 30 MG/ML IJ SOLN
30.0000 mg | Freq: Once | INTRAMUSCULAR | Status: AC
  Administered 2021-08-17: 30 mg via INTRAMUSCULAR
  Filled 2021-08-17: qty 1

## 2021-08-17 NOTE — ED Triage Notes (Signed)
Pt reports that he has left foot pain, he works in [employer] and on his feet a lot. He is also complaining of a headache. He tried to go to sleep to relieve his headache then his foot started throbbing. He is able to ambulate without difficulty. Ibuprofen is not helping with pain.

## 2021-08-17 NOTE — ED Provider Notes (Signed)
[facility]  [facility] Emergency Department Provider Note   ____________________________________________   Event Date/Time   First MD Initiated Contact with Patient 08/17/21 [phone]     (approximate)  I have reviewed the triage vital signs and the nursing notes.   HISTORY  Chief Complaint Foot Pain and Headache    HPI Henry Jackson is a 35 y.o. male with past medical history of asthma who presents to the ED complaining of foot pain.  Patient reports that he works at a [employer] and is on his feet for much of the day.  For the past few weeks, he has been dealing with increasing pain over the medial portion of his left foot, near the base of his great toe.  He denies falls or recent trauma to his foot.  He has been able to ambulate on the foot but this exacerbates his pain.  He has not noticed any redness, swelling, or wounds.  He has been taking ibuprofen without significant relief.  He also reports a headache developing overnight which has been improving since onset.  He has not had any associated vision changes, speech changes, numbness, or weakness.        Past Medical History:  Diagnosis Date   Asthma     There are no problems to display for this patient.   History reviewed. No pertinent surgical history.  Prior to Admission medications   Medication Sig Start Date End Date Taking? Authorizing Provider  naproxen (NAPROSYN) 500 MG tablet Take 1 tablet (500 mg total) by mouth 2 (two) times daily with a meal for 6 days. 08/17/21 08/23/21 Yes Chesley Noon, MD  albuterol (VENTOLIN HFA) 108 (90 Base) MCG/ACT inhaler Inhale 2 puffs into the lungs every 6 (six) hours as needed for wheezing or shortness of breath. 03/17/21   Tommi Rumps, PA-C  Saccharomyces boulardii (PROBIOTIC) 250 MG CAPS Take 1 capsule by mouth in the morning and at bedtime. 07/15/21   Willy Eddy, MD    Allergies Iodinated diagnostic agents, Penicillins, Shellfish  allergy, and Sulfa antibiotics  No family history on file.  Social History Social History   Tobacco Use   Smoking status: Every Day    Packs/day: 1.00    Types: Cigarettes   Smokeless tobacco: Never  Substance Use Topics   Alcohol use: Yes   Drug use: No    Review of Systems  Constitutional: No fever/chills Eyes: No visual changes. ENT: No sore throat. Cardiovascular: Denies chest pain. Respiratory: Denies shortness of breath. Gastrointestinal: No abdominal pain.  No nausea, no vomiting.  No diarrhea.  No constipation. Genitourinary: Negative for dysuria. Musculoskeletal: Negative for back pain.  Positive for left foot pain. Skin: Negative for rash. Neurological: Positive for headaches, negative for focal weakness or numbness.  ____________________________________________   PHYSICAL EXAM:  VITAL SIGNS: ED Triage Vitals [08/17/21 0724]  Enc Vitals Group     BP (!) 149/116     Pulse Rate 75     Resp 18     Temp 98.6 F (37 C)     Temp Source Oral     SpO2 97 %     Weight (!) 310 lb (140.6 kg)     Height 5\' 7"  (1.702 m)     Head Circumference      Peak Flow      Pain Score 8     Pain Loc      Pain Edu?      Excl. in GC?  Constitutional: Alert and oriented. Eyes: Conjunctivae are normal. Head: Atraumatic. Nose: No congestion/rhinnorhea. Mouth/Throat: Mucous membranes are moist. Neck: Normal ROM Cardiovascular: Normal rate, regular rhythm. Grossly normal heart sounds.  2+ DP pulses bilaterally. Respiratory: Normal respiratory effort.  No retractions. Lungs CTAB. Gastrointestinal: Soft and nontender. No distention. Genitourinary: deferred Musculoskeletal: Tenderness to palpation near the base of left great toe with associated hallux valgus.  No erythema, edema, warmth, or skin defects.  No calf edema or tenderness noted. Neurologic:  Normal speech and language. No gross focal neurologic deficits are appreciated. Skin:  Skin is warm, dry and intact. No  rash noted. Psychiatric: Mood and affect are normal. Speech and behavior are normal.  ____________________________________________   LABS (all labs ordered are listed, but only abnormal results are displayed)  Labs Reviewed - No data to display   PROCEDURES  Procedure(s) performed (including Critical Care):  Procedures   ____________________________________________   INITIAL IMPRESSION / ASSESSMENT AND PLAN / ED COURSE      35 year old male with past medical history of asthma presents to the ED with increasing pain in his left foot over the past couple of weeks without associated trauma.  X-ray reviewed by me and shows no fracture or dislocation, does show mild hallux valgus deformity.  With tenderness to palpation in this area, I suspect his symptoms are due to this hallux valgus deformity.  He was counseled on management with NSAIDs, ice, and rest.  We will provide follow-up with podiatry and he was counseled to return to the ED for new or worsening symptoms.  Patient agrees with plan.      ____________________________________________   FINAL CLINICAL IMPRESSION(S) / ED DIAGNOSES  Final diagnoses:  Foot pain, left  Bunion of great toe of left foot     ED Discharge Orders          Ordered    naproxen (NAPROSYN) 500 MG tablet  2 times daily with meals        08/17/21 0804             Note:  This document was prepared using Dragon voice recognition software and may include unintentional dictation errors.    Chesley Noon, MD 08/17/21 [phone]

## 2021-12-02 ENCOUNTER — Emergency Department
Admission: EM | Admit: 2021-12-02 | Discharge: 2021-12-02 | Disposition: A | Discharge status: Home / Self Care | Payer: Self-pay | Attending: Emergency Medicine | Admitting: Emergency Medicine

## 2021-12-02 ENCOUNTER — Other Ambulatory Visit: Payer: Self-pay

## 2021-12-02 ENCOUNTER — Emergency Department: Payer: Self-pay

## 2021-12-02 DIAGNOSIS — F17210 Nicotine dependence, cigarettes, uncomplicated: Secondary | ICD-10-CM | POA: Insufficient documentation

## 2021-12-02 DIAGNOSIS — J111 Influenza due to unidentified influenza virus with other respiratory manifestations: Secondary | ICD-10-CM

## 2021-12-02 DIAGNOSIS — J101 Influenza due to other identified influenza virus with other respiratory manifestations: Secondary | ICD-10-CM | POA: Insufficient documentation

## 2021-12-02 DIAGNOSIS — J45909 Unspecified asthma, uncomplicated: Secondary | ICD-10-CM | POA: Insufficient documentation

## 2021-12-02 DIAGNOSIS — Z20822 Contact with and (suspected) exposure to covid-19: Secondary | ICD-10-CM | POA: Insufficient documentation

## 2021-12-02 LAB — RESP PANEL BY RT-PCR (FLU A&B, COVID) ARPGX2
Influenza A by PCR: POSITIVE — AB
Influenza B by PCR: NEGATIVE
SARS Coronavirus 2 by RT PCR: NEGATIVE

## 2021-12-02 NOTE — ED Triage Notes (Signed)
Pt c/o cough congestion , sinus congestion , fever and body aches since Tuesday.Marland Kitchen

## 2021-12-02 NOTE — Discharge Instructions (Addendum)
Your chest x-ray is normal does not show any signs of any pneumonia.  Use your albuterol inhaler as directed.  Take over-the-counter Tylenol, Motrin for relief of body aches and fevers.  Consider Delsym (dextromethorphan) cough syrup for cough relief.  Drink plenty of fluids and rest as necessary.

## 2021-12-02 NOTE — ED Notes (Signed)
DC ppw provided. Pt provides verbal consent for dc. Pt off unti

## 2021-12-02 NOTE — ED Provider Notes (Signed)
[facility] Emergency Department Provider Note ____________________________________________  Time seen: 43  I have reviewed the triage vital signs and the nursing notes.  HISTORY  Chief Complaint  URI   HPI Henry Jackson is a 35 y.o. male presents himself to the ED for evaluation of cough, congestion, fever and body aches since Tuesday.  Reports similar symptoms in his wife and roommate.  Past Medical History:  Diagnosis Date   Asthma     There are no problems to display for this patient.   History reviewed. No pertinent surgical history.  Prior to Admission medications   Medication Sig Start Date End Date Taking? Authorizing Provider  albuterol (VENTOLIN HFA) 108 (90 Base) MCG/ACT inhaler Inhale 2 puffs into the lungs every 6 (six) hours as needed for wheezing or shortness of breath. 03/17/21   Tommi Rumps, PA-C  Saccharomyces boulardii (PROBIOTIC) 250 MG CAPS Take 1 capsule by mouth in the morning and at bedtime. 07/15/21   Willy Eddy, MD    Allergies Iodinated diagnostic agents, Penicillins, Shellfish allergy, and Sulfa antibiotics  No family history on file.  Social History Social History   Tobacco Use   Smoking status: Every Day    Packs/day: 1.00    Types: Cigarettes   Smokeless tobacco: Never  Substance Use Topics   Alcohol use: Yes   Drug use: No    Review of Systems  Constitutional: Positive for fever. Eyes: Negative for visual changes. ENT: Negative for sore throat.  Reports sinus congestion Cardiovascular: Negative for chest pain. Respiratory: Negative for shortness of breath.  Reports cough Gastrointestinal: Negative for abdominal pain, vomiting and diarrhea. Genitourinary: Negative for dysuria. Musculoskeletal: Negative for back pain.  Reports generalized body aches Skin: Negative for rash. Neurological: Negative for headaches, focal weakness or  numbness. ____________________________________________  PHYSICAL EXAM:  VITAL SIGNS: ED Triage Vitals  Enc Vitals Group     BP 12/02/21 1732 (!) 157/97     Pulse Rate 12/02/21 1732 86     Resp 12/02/21 1732 18     Temp 12/02/21 1732 99.5 F (37.5 C)     Temp Source 12/02/21 1732 Oral     SpO2 12/02/21 1732 95 %     Weight 12/02/21 1734 (!) 330 lb (149.7 kg)     Height 12/02/21 1734 5\' 7"  (1.702 m)     Head Circumference --      Peak Flow --      Pain Score 12/02/21 1733 0     Pain Loc --      Pain Edu? --      Excl. in GC? --     Constitutional: Alert and oriented. Well appearing and in no distress. Head: Normocephalic and atraumatic. Eyes: Conjunctivae are normal. PERRL. Normal extraocular movements Ears: Canals clear.  Cardiovascular: Normal rate, regular rhythm. Normal distal pulses. Respiratory: Normal respiratory effort. No wheezes/rales/rhonchi. Gastrointestinal: Soft and nontender. No distention. Musculoskeletal: Nontender with normal range of motion in all extremities.  Neurologic:  Normal gait without ataxia. Normal speech and language. No gross focal neurologic deficits are appreciated. Skin:  Skin is warm, dry and intact. No rash noted. Psychiatric: Mood and affect are normal. Patient exhibits appropriate insight and judgment. ____________________________________________    {LABS (pertinent positives/negatives) Labs Reviewed  RESP PANEL BY RT-PCR (FLU A&B, COVID) ARPGX2 - Abnormal; Notable for the following components:      Result Value   Influenza A by PCR POSITIVE (*)    All other components within normal limits  ___________________________________________  {EKG  ____________________________________________   RADIOLOGY Official radiology report(s): DG Chest 2 View  Result Date: 12/02/2021 CLINICAL DATA:  Cough, congestion, fever EXAM: CHEST - 2 VIEW COMPARISON:  03/17/2021 FINDINGS: Frontal and lateral views of the chest demonstrate an unremarkable  cardiac silhouette. No acute airspace disease, effusion, or pneumothorax. No acute bony abnormalities. IMPRESSION: 1. No acute intrathoracic process. Electronically Signed   By: Sharlet Salina M.D.   On: 12/02/2021 18:45   ____________________________________________  PROCEDURES   Procedures ____________________________________________   INITIAL IMPRESSION / ASSESSMENT AND PLAN / ED COURSE  As part of my medical decision making, I reviewed the following data within the electronic MEDICAL RECORD NUMBER Labs reviewed as noted and Notes from prior ED visits  DDX: influenza, RSV, covid, viral URI  Patient ED evaluation of subjective fevers, cough, congestion, and bodies.  Evaluate for complaints in ED, found to have a viral panel test confirming influenza A.  Patient is beyond the treatment window for Tamiflu, but will be discharged with instructions to take OTC medicines including Tylenol, Motrin, and Delsym.  He will continue with his albuterol as needed.  He is given a work note for end of this weekend.  He will follow-up with primary provider return to the ED if necessary.  Henry Jackson was evaluated in Emergency Department on 12/02/2021 for the symptoms described in the history of present illness. He was evaluated in the context of the global COVID-19 pandemic, which necessitated consideration that the patient might be at risk for infection with the SARS-CoV-2 virus that causes COVID-19. Institutional protocols and algorithms that pertain to the evaluation of patients at risk for COVID-19 are in a state of rapid change based on information released by regulatory bodies including the CDC and federal and state organizations. These policies and algorithms were followed during the patient's care in the ED. ____________________________________________  FINAL CLINICAL IMPRESSION(S) / ED DIAGNOSES  Final diagnoses:  Influenza      Lissa Hoard, PA-C 12/02/21 1910    Sharman Cheek, MD 12/03/21 [phone]

## 2022-03-01 ENCOUNTER — Other Ambulatory Visit: Payer: Self-pay

## 2022-03-01 ENCOUNTER — Encounter: Payer: Self-pay | Admitting: Emergency Medicine

## 2022-03-01 ENCOUNTER — Emergency Department
Admission: EM | Admit: 2022-03-01 | Discharge: 2022-03-01 | Disposition: A | Discharge status: Home / Self Care | Payer: Self-pay | Attending: Emergency Medicine | Admitting: Emergency Medicine

## 2022-03-01 ENCOUNTER — Emergency Department: Payer: Self-pay

## 2022-03-01 DIAGNOSIS — J45909 Unspecified asthma, uncomplicated: Secondary | ICD-10-CM | POA: Insufficient documentation

## 2022-03-01 DIAGNOSIS — X500XXA Overexertion from strenuous movement or load, initial encounter: Secondary | ICD-10-CM | POA: Insufficient documentation

## 2022-03-01 DIAGNOSIS — Y990 Civilian activity done for income or pay: Secondary | ICD-10-CM | POA: Insufficient documentation

## 2022-03-01 DIAGNOSIS — M25512 Pain in left shoulder: Secondary | ICD-10-CM

## 2022-03-01 DIAGNOSIS — F17210 Nicotine dependence, cigarettes, uncomplicated: Secondary | ICD-10-CM | POA: Insufficient documentation

## 2022-03-01 DIAGNOSIS — M659 Synovitis and tenosynovitis, unspecified: Secondary | ICD-10-CM | POA: Insufficient documentation

## 2022-03-01 MED ORDER — KETOROLAC TROMETHAMINE 30 MG/ML IJ SOLN
30.0000 mg | Freq: Once | INTRAMUSCULAR | Status: AC
  Administered 2022-03-01: 30 mg via INTRAMUSCULAR
  Filled 2022-03-01: qty 1

## 2022-03-01 MED ORDER — MELOXICAM 15 MG PO TABS: 15.0000 mg | ORAL_TABLET | Freq: Every day | ORAL | 0 refills | Status: AC

## 2022-03-01 NOTE — ED Notes (Signed)
No bruising noted. No "popping" noise on palpation. ?

## 2022-03-01 NOTE — Discharge Instructions (Signed)
Follow up with Dr. Okey Dupre who is on call for orthopedics today if you are not improving.  Contact information is listed on your discharge papers.  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  You may also use ice or heat to your shoulder for comfort  ?

## 2022-03-01 NOTE — ED Triage Notes (Signed)
Pt comes into the ED via POV c/o left elbow and shoulder pain that has been ongoing x 1 month.  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  Pt denies any known injury other than constant moving and motion at work.  ?

## 2022-03-01 NOTE — ED Provider Notes (Signed)
? ?[facility] ?Provider Note ? ? ? Event Date/Time  ? First MD Initiated Contact with Patient 03/01/22 [phone]   ?  (approximate) ? ? ?History  ? ?Elbow Pain ? ? ?HPI ? ?Henry Jackson is a 36 y.o. male presents to the ED with complaint of left shoulder pain for approximately 1 month.  Patient states that he does a lot of heavy lifting and repetitive work.  He reports that there was no direct trauma to his shoulder.  He has been taking ibuprofen 800 mg twice a day with minimal relief of his shoulder pain.  Patient is a pack-a-day smoker and has history of asthma. ?  ? ? ?Physical Exam  ? ?Triage Vital Signs: ?ED Triage Vitals [03/01/22 0709]  ?Enc Vitals Group  ?   BP 130/73  ?   Pulse Rate 90  ?   Resp 16  ?   Temp 98.9 ?F (37.2 ?C)  ?   Temp Source Oral  ?   SpO2 98 %  ?   Weight (!) 330 lb 0.5 oz (149.7 kg)  ?   Height 5\' 7"  (1.702 m)  ?   Head Circumference   ?   Peak Flow   ?   Pain Score 7  ?   Pain Loc   ?   Pain Edu?   ?   Excl. in GC?   ? ? ?Most recent vital signs: ?Vitals:  ? 03/01/22 0709  ?BP: 130/73  ?Pulse: 90  ?Resp: 16  ?Temp: 98.9 ?F (37.2 ?C)  ?SpO2: 98%  ? ? ? ?General: Awake, no distress.  ?CV:  Good peripheral perfusion.  Heart regular rate and rhythm without murmur. ?Resp:  Normal effort.  Lungs are clear bilaterally. ?Abd:  No distention.  ?Other:  Range of motion of the left shoulder is minimally restricted secondary to discomfort.  No obvious crepitus is noted.  No erythema or warmth present.  There is some tenderness on palpation at the deltoid and AC joint area.  Radial pulse present.  There is generalized tenderness with range of motion of the wrist in extension.  No edema of the joint present.  There is tenderness on palpation of the left lateral epicondylar area without erythema or edema present.  Patient is able to make a fist without any difficulty. ? ? ?ED Results / Procedures / Treatments  ? ?Labs ?(all labs ordered are listed, but only abnormal results are  displayed) ?Labs Reviewed - No data to display ? ? ? ? ?RADIOLOGY ?Left shoulder x-ray was reviewed by myself and no fracture or dislocation noted.  Radiology report is negative shoulder. ? ? ? ?PROCEDURES: ? ?Critical Care performed:  ? ?Procedures ? ? ?MEDICATIONS ORDERED IN ED: ?Medications  ?ketorolac (TORADOL) 30 MG/ML injection 30 mg (30 mg Intramuscular Given 03/01/22 0819)  ? ? ? ?IMPRESSION / MDM / ASSESSMENT AND PLAN / ED COURSE  ?I reviewed the triage vital signs and the nursing notes. ? ? ?Differential diagnosis includes, but is not limited to, acute left shoulder pain, bursitis, tendinitis, elbow epicondylitis, muscle skeletal strain. ? ?36 year old male presents to the ED with complaint of left shoulder and elbow pain for approximately 1 month.  Patient denies any injury.  Patient states that he does repetitive motion at his job.  He has been taking ibuprofen 800 mg twice daily with minimal relief.  He denies any previous injury.  Exam is negative for any crepitus, edema, erythema, deformity.  There is tenderness  on palpation of the deltoid/AC joint and the left lateral epicondylar region.  Patient was given Toradol 30 mg IM and a prescription for meloxicam 15 mg 1 daily with food for the next 30 days.  If he does not see any improvement he should call make an appointment with Dr. Okey Dupre who is on-call for orthopedics for further evaluation and treatment. ? ? ?FINAL CLINICAL IMPRESSION(S) / ED DIAGNOSES  ? ?Final diagnoses:  ?Acute pain of left shoulder  ?Tenosynovitis of forearm  ? ? ? ?Rx / DC Orders  ? ?ED Discharge Orders   ? ?      Ordered  ?  meloxicam (MOBIC) 15 MG tablet  Daily       ? 03/01/22 0817  ? ?  ?  ? ?  ? ? ? ?Note:  This document was prepared using Dragon voice recognition software and may include unintentional dictation errors. ?  ?Tommi Rumps, PA-C ?03/01/22 2423 ? ?  ?Minna Antis, MD ?03/01/22 1509 ? ?

## 2023-04-02 ENCOUNTER — Emergency Department
Admission: EM | Admit: 2023-04-02 | Discharge: 2023-04-02 | Disposition: A | Discharge status: Home / Self Care | Payer: BC Managed Care – PPO | Attending: Emergency Medicine | Admitting: Emergency Medicine

## 2023-04-02 ENCOUNTER — Other Ambulatory Visit: Payer: Self-pay

## 2023-04-02 DIAGNOSIS — M7661 Achilles tendinitis, right leg: Secondary | ICD-10-CM | POA: Insufficient documentation

## 2023-04-02 DIAGNOSIS — M79671 Pain in right foot: Secondary | ICD-10-CM | POA: Diagnosis present

## 2023-04-02 MED ORDER — NAPROXEN 500 MG PO TABS: 500.0000 mg | ORAL_TABLET | Freq: Two times a day (BID) | ORAL | 2 refills | Status: AC

## 2023-04-02 NOTE — ED Triage Notes (Signed)
Pt reports left ankle swelling x2 wks. Pt reports swelling of left ankle continues to get worse. Pt reports pain behind left knee as well and started having pain after slipping on the floor at work. Pt denies injury. Pt denies CP or SOB. Pt reports only medical hx is asthma.

## 2023-04-02 NOTE — ED Provider Notes (Signed)
   [facility] Provider Note    Event Date/Time   First MD Initiated Contact with Patient 04/02/23 [phone]     (approximate)   History   Joint Swelling   HPI  Henry Jackson is a 37 y.o. male who presents with complaints of right foot pain.  Patient reports 2 weeks ago he missed stepped off of a machine at work and thinks that he may have injured his ankle at that time.  He reports posterior right ankle pain intermittently since then with intermittent swelling, worse with palpation     Physical Exam   Triage Vital Signs: ED Triage Vitals [04/02/23 0732]  Enc Vitals Group     BP (!) 163/114     Pulse Rate 82     Resp 18     Temp 98.1 F (36.7 C)     Temp Source Oral     SpO2 99 %     Weight      Height      Head Circumference      Peak Flow      Pain Score 6     Pain Loc      Pain Edu?      Excl. in GC?     Most recent vital signs: Vitals:   04/02/23 0732  BP: (!) 163/114  Pulse: 82  Resp: 18  Temp: 98.1 F (36.7 C)  SpO2: 99%     General: Awake, no distress.  CV:  Good peripheral perfusion.  Resp:  Normal effort.  Abd:  No distention.  Other:  Patient with tenderness over the inferior portion of the right Achilles tendon, mild swelling, tendon is intact no other abnormalities   ED Results / Procedures / Treatments   Labs (all labs ordered are listed, but only abnormal results are displayed) Labs Reviewed - No data to display   EKG     RADIOLOGY     PROCEDURES:  Critical Care performed:   Procedures   MEDICATIONS ORDERED IN ED: Medications - No data to display   IMPRESSION / MDM / ASSESSMENT AND PLAN / ED COURSE  I reviewed the triage vital signs and the nursing notes. Patient's presentation is most consistent with acute, uncomplicated illness.  Exam is most consistent with Achilles tendinitis, not consistent with complete tear, reassuring exam  Premade ankle splint applied for support, NSAIDs, ice  recommended, outpatient follow-up with podiatry as needed        FINAL CLINICAL IMPRESSION(S) / ED DIAGNOSES   Final diagnoses:  Achilles tendinitis of right lower extremity     Rx / DC Orders   ED Discharge Orders          Ordered    Ambulatory referral to Podiatry        04/02/23 0809    naproxen (NAPROSYN) 500 MG tablet  2 times daily with meals        04/02/23 0809             Note:  This document was prepared using Dragon voice recognition software and may include unintentional dictation errors.   Jene Every, MD 04/02/23 [phone]

## 2023-07-31 ENCOUNTER — Emergency Department
Admission: EM | Admit: 2023-07-31 | Discharge: 2023-07-31 | Disposition: A | Discharge status: Home / Self Care | Payer: Self-pay | Attending: Emergency Medicine | Admitting: Emergency Medicine

## 2023-07-31 ENCOUNTER — Emergency Department: Payer: Self-pay

## 2023-07-31 ENCOUNTER — Other Ambulatory Visit: Payer: Self-pay

## 2023-07-31 DIAGNOSIS — G510 Bell's palsy: Secondary | ICD-10-CM

## 2023-07-31 LAB — HEMOGLOBIN A1C
Hgb A1c MFr Bld: 11.9 % — ABNORMAL HIGH (ref 4.8–5.6)
Mean Plasma Glucose: 294.83 mg/dL

## 2023-07-31 LAB — CBC
HCT: 46.8 % (ref 39.0–52.0)
Hemoglobin: 15.4 g/dL (ref 13.0–17.0)
MCH: 26.7 pg (ref 26.0–34.0)
MCHC: 32.9 g/dL (ref 30.0–36.0)
MCV: 81.3 fL (ref 80.0–100.0)
Platelets: 226 10*3/uL (ref 150–400)
RBC: 5.76 MIL/uL (ref 4.22–5.81)
RDW: 12.7 % (ref 11.5–15.5)
WBC: 7.2 10*3/uL (ref 4.0–10.5)
nRBC: 0 % (ref 0.0–0.2)

## 2023-07-31 LAB — COMPREHENSIVE METABOLIC PANEL
ALT: 15 U/L (ref 0–44)
AST: 18 U/L (ref 15–41)
Albumin: 3.9 g/dL (ref 3.5–5.0)
Alkaline Phosphatase: 48 U/L (ref 38–126)
Anion gap: 11 (ref 5–15)
BUN: 12 mg/dL (ref 6–20)
CO2: 22 mmol/L (ref 22–32)
Calcium: 8.9 mg/dL (ref 8.9–10.3)
Chloride: 99 mmol/L (ref 98–111)
Creatinine, Ser: 0.79 mg/dL (ref 0.61–1.24)
GFR, Estimated: >60 mL/min (ref >60)
Glucose, Bld: 230 mg/dL — ABNORMAL HIGH (ref 70–99)
Potassium: 3.7 mmol/L (ref 3.5–5.1)
Sodium: 132 mmol/L — ABNORMAL LOW (ref 135–145)
Total Bilirubin: 0.8 mg/dL (ref 0.3–1.2)
Total Protein: 8.1 g/dL (ref 6.5–8.1)

## 2023-07-31 MED ORDER — ACYCLOVIR 400 MG PO TABS: 400.0000 mg | ORAL_TABLET | Freq: Every day | ORAL | 0 refills | Status: AC

## 2023-07-31 MED ORDER — METFORMIN HCL 500 MG PO TABS: 500.0000 mg | ORAL_TABLET | Freq: Two times a day (BID) | ORAL | 2 refills | Status: DC

## 2023-07-31 MED ORDER — SODIUM CHLORIDE 0.9 % IV BOLUS
1000.0000 mL | Freq: Once | INTRAVENOUS | Status: AC
  Administered 2023-07-31: 1000 mL via INTRAVENOUS

## 2023-07-31 NOTE — ED Triage Notes (Addendum)
Pt presents to ED via [facility] from home with c/o of R sided face numbness that has been ongoing since Thursday. Pt denies any weakness. Pt states face "feels weird". NAD noted. Pt states he's here now because of feeling that his eye lid is numb and the R side of his nose that started yesterday.    This RN spoke to Dr. York Cerise, no new orders at this time.

## 2023-07-31 NOTE — ED Notes (Signed)
See triage note  Presents with some facial numbness to right side of face which started on Thursday  Then he also had some slurred speech on Saturday  No weakness  Speech is better at present  Alos did have a headache on sat/sun

## 2023-07-31 NOTE — ED Provider Notes (Signed)
[facility] Provider Note    Event Date/Time   First MD Initiated Contact with Patient 07/31/23 1050     (approximate)   History   Numbness   HPI  Henry Jackson is a 37 y.o. male who presents to the urgency department today because of concerns for right facial numbness and weakness.  The patient first noticed some change in sensation around his lips a few days ago.  It then progressed to where he now also feels some weakness around his right eye.  He likens it to having lidocaine.  The patient has had some headache with this.  He does state that when he started having the symptoms they checked his blood sugar at home and it was noted to be high.  Patient does not have history of diabetes.     Physical Exam   Triage Vital Signs: ED Triage Vitals [07/31/23 0959]  Encounter Vitals Group     BP (!) 138/97     Systolic BP Percentile      Diastolic BP Percentile      Pulse Rate 93     Resp 17     Temp 98.3 F (36.8 C)     Temp Source Oral     SpO2 97 %     Weight      Height      Head Circumference      Peak Flow      Pain Score 0     Pain Loc      Pain Education      Exclude from Growth Chart     Most recent vital signs: Vitals:   07/31/23 0959  BP: (!) 138/97  Pulse: 93  Resp: 17  Temp: 98.3 F (36.8 C)  SpO2: 97%   General: Awake, alert, oriented. CV:  Good peripheral perfusion. Regular rate and rhythm. Resp:  Normal effort. Lungs clear. Abd:  No distention.  Other:  PERRL. EOMI. Right facial droop, while able to move the right eyebrow, it is less then left eyebrow.   ED Results / Procedures / Treatments   Labs (all labs ordered are listed, but only abnormal results are displayed) Labs Reviewed  COMPREHENSIVE METABOLIC PANEL - Abnormal; Notable for the following components:      Result Value   Sodium 132 (*)    Glucose, Bld 230 (*)    All other components within normal limits  CBC  HEMOGLOBIN A1C     EKG  I,  Phineas Semen, attending physician, personally viewed and interpreted this EKG  EKG Time: 0958 Rate: 84 Rhythm: normal sinus rhythm Axis: normal Intervals: qtc 397 QRS: narrow ST changes: no st elevation, t wave inversion III, aVF Impression: abnormal ekg   RADIOLOGY I independently interpreted and visualized the MR brain. My interpretation: No large mass Radiology interpretation:  IMPRESSION:  No acute intracranial process. No specific finding to explain  right-sided facial numbness.     PROCEDURES:  Critical Care performed: No    MEDICATIONS ORDERED IN ED: Medications - No data to display   IMPRESSION / MDM / ASSESSMENT AND PLAN / ED COURSE  I reviewed the triage vital signs and the nursing notes.                              Differential diagnosis includes, but is not limited to, bell's palsy, hyperglycemia, CVA  Patient's presentation is most consistent with acute presentation with  potential threat to life or bodily function.   Patient presented to the emergency department today because of concerns for right facial weakness and numbness.  On exam he does have weakness to the right face.  While I think Bell's palsy likely will obtain MRI to evaluate for CVA. Additionally will give IVFs to help lower blood sugar.   MRI without concern for acute stroke. At this time I do think bell's palsy likely. I also have concern that patient is diabetic given reports of sugars >300 at home. Will start on metformin. Will hold off on steroids for bell's given concern for hyperglycemia. Will give antiviral. Discussed eye precautions.      FINAL CLINICAL IMPRESSION(S) / ED DIAGNOSES   Final diagnoses:  Bell's palsy     Note:  This document was prepared using Dragon voice recognition software and may include unintentional dictation errors.    Phineas Semen, MD 07/31/23 [phone]

## 2023-07-31 NOTE — Discharge Instructions (Addendum)
Please seek medical attention for any high fevers, chest pain, shortness of breath, change in behavior, persistent vomiting, bloody stool or any other new or concerning symptoms.  

## 2024-01-03 ENCOUNTER — Other Ambulatory Visit: Payer: Self-pay

## 2024-01-03 ENCOUNTER — Encounter: Payer: Self-pay | Admitting: Emergency Medicine

## 2024-01-03 ENCOUNTER — Emergency Department: Payer: Self-pay

## 2024-01-03 ENCOUNTER — Emergency Department
Admission: EM | Admit: 2024-01-03 | Discharge: 2024-01-03 | Disposition: A | Discharge status: Home / Self Care | Payer: Self-pay | Attending: Emergency Medicine | Admitting: Emergency Medicine

## 2024-01-03 DIAGNOSIS — J984 Other disorders of lung: Secondary | ICD-10-CM

## 2024-01-03 DIAGNOSIS — E139 Other specified diabetes mellitus without complications: Secondary | ICD-10-CM

## 2024-01-03 DIAGNOSIS — E119 Type 2 diabetes mellitus without complications: Secondary | ICD-10-CM | POA: Insufficient documentation

## 2024-01-03 DIAGNOSIS — Z20822 Contact with and (suspected) exposure to covid-19: Secondary | ICD-10-CM | POA: Insufficient documentation

## 2024-01-03 DIAGNOSIS — J189 Pneumonia, unspecified organism: Secondary | ICD-10-CM | POA: Insufficient documentation

## 2024-01-03 DIAGNOSIS — Z7984 Long term (current) use of oral hypoglycemic drugs: Secondary | ICD-10-CM | POA: Insufficient documentation

## 2024-01-03 DIAGNOSIS — J45909 Unspecified asthma, uncomplicated: Secondary | ICD-10-CM | POA: Insufficient documentation

## 2024-01-03 LAB — BASIC METABOLIC PANEL
Anion gap: 11 (ref 5–15)
BUN: 17 mg/dL (ref 6–20)
CO2: 22 mmol/L (ref 22–32)
Calcium: 8.9 mg/dL (ref 8.9–10.3)
Chloride: 100 mmol/L (ref 98–111)
Creatinine, Ser: 0.91 mg/dL (ref 0.61–1.24)
GFR, Estimated: >60 mL/min (ref >60)
Glucose, Bld: 210 mg/dL — ABNORMAL HIGH (ref 70–99)
Potassium: 3.3 mmol/L — ABNORMAL LOW (ref 3.5–5.1)
Sodium: 133 mmol/L — ABNORMAL LOW (ref 135–145)

## 2024-01-03 LAB — TROPONIN I (HIGH SENSITIVITY)
Troponin I (High Sensitivity): 11 ng/L (ref <18)
Troponin I (High Sensitivity): 10 ng/L (ref <18)

## 2024-01-03 LAB — CBC
HCT: 44.8 % (ref 39.0–52.0)
Hemoglobin: 14.8 g/dL (ref 13.0–17.0)
MCH: 27 pg (ref 26.0–34.0)
MCHC: 33 g/dL (ref 30.0–36.0)
MCV: 81.6 fL (ref 80.0–100.0)
Platelets: 210 10*3/uL (ref 150–400)
RBC: 5.49 MIL/uL (ref 4.22–5.81)
RDW: 12.5 % (ref 11.5–15.5)
WBC: 8.7 10*3/uL (ref 4.0–10.5)
nRBC: 0 % (ref 0.0–0.2)

## 2024-01-03 LAB — D-DIMER, QUANTITATIVE: D-Dimer, Quant: 0.34 ug{FEU}/mL (ref 0.00–0.50)

## 2024-01-03 LAB — RESP PANEL BY RT-PCR (RSV, FLU A&B, COVID)  RVPGX2
Influenza A by PCR: NEGATIVE
Influenza B by PCR: NEGATIVE
Resp Syncytial Virus by PCR: NEGATIVE
SARS Coronavirus 2 by RT PCR: NEGATIVE

## 2024-01-03 MED ORDER — METFORMIN HCL 500 MG PO TABS: 500.0000 mg | ORAL_TABLET | Freq: Two times a day (BID) | ORAL | 0 refills | Status: AC

## 2024-01-03 MED ORDER — DOXYCYCLINE HYCLATE 100 MG PO TABS: 100.0000 mg | ORAL_TABLET | Freq: Two times a day (BID) | ORAL | 0 refills | Status: AC

## 2024-01-03 NOTE — ED Triage Notes (Signed)
 Patient ambulatory to triage with steady gait, without difficulty or distress noted; pt reports mid CP, nonradiating with some SHOB; st yesterday at work breathed in alot of sawdust and has coughed up some blood

## 2024-01-03 NOTE — ED Provider Notes (Signed)
 [facility] Provider Note    Event Date/Time   First MD Initiated Contact with Patient 01/03/24 [phone]     (approximate)   History   Chest Pain   HPI  Henry Jackson is a 38 y.o. male who comes in with some chest pain.  Patient has a history of diabetes but is not currently taking his metformin  secondary to running out of it.  He reports that he was at work yesterday inhaled some dust.  He reports that this morning he developed some chest pain, shortness of breath and coughing up blood-tinged sputum.  This was around 4 AM.  He states that he does has asthma and he tried to use his inhaler without any relief in symptoms.  He denies any swelling in his legs or other risk factors for blood clots.  No history of heart attacks.   Physical Exam   Triage Vital Signs: ED Triage Vitals  Encounter Vitals Group     BP 01/03/24 0613 (!) 150/95     Systolic BP Percentile --      Diastolic BP Percentile --      Pulse Rate 01/03/24 0613 95     Resp 01/03/24 0613 19     Temp 01/03/24 0613 98 F (36.7 C)     Temp Source 01/03/24 0613 Oral     SpO2 01/03/24 0613 97 %     Weight 01/03/24 0619 (!) 320 lb (145.2 kg)     Height 01/03/24 0619 5' 7 (1.702 m)     Head Circumference --      Peak Flow --      Pain Score 01/03/24 0619 4     Pain Loc --      Pain Education --      Exclude from Growth Chart --     Most recent vital signs: Vitals:   01/03/24 0613  BP: (!) 150/95  Pulse: 95  Resp: 19  Temp: 98 F (36.7 C)  SpO2: 97%     General: Awake, no distress.  CV:  Good peripheral perfusion.  Resp:  Normal effort.  Clear lungs, no wheezing Abd:  No distention.  Other:  No swelling in legs, no calf tenderness   ED Results / Procedures / Treatments   Labs (all labs ordered are listed, but only abnormal results are displayed) Labs Reviewed  BASIC METABOLIC PANEL - Abnormal; Notable for the following components:      Result Value   Sodium 133 (*)     Potassium 3.3 (*)    Glucose, Bld 210 (*)    All other components within normal limits  CBC  TROPONIN I (HIGH SENSITIVITY)     EKG  My interpretation of EKG:  Normal sinus rate of 89 without any ST elevation, T wave version in lead III and aVF, normal intervals.  Reviewed prior EKG and looks similar to prior  RADIOLOGY I have reviewed the xray personally and interpreted patient has bilateral opacities  PROCEDURES:  Critical Care performed: No  Procedures   MEDICATIONS ORDERED IN ED: Medications - No data to display   IMPRESSION / MDM / ASSESSMENT AND PLAN / ED COURSE  I reviewed the triage vital signs and the nursing notes.   Patient's presentation is most consistent with acute presentation with potential threat to life or bodily function.   Differential ACS, pneumonitis, COVID, PE.  Low Wells score will proceed with D-dimer.  Repeat cardiac marker to evaluate for ACS.  Troponin.  CBC reassuring BMP.  Glucose elevated.  Discussed with patient and he understands that he needs to start taking his metformin  again.    No wheezing on examination and given patient is diabetic with A1c over 11 previously discussed with him my concern of using steroids at this time.  I do not feel a get would have much benefit he is not hypoxic and no wheezing.  He is agreeable to holding off and just starting back on his metformin .  He is reassured that there is no heart attack, blood clot.  We discussed doxycycline  if things are getting worse with fever out these typically self resolved.  We also discussed returning for repeat oxygen level if he starts feeling more short of breath but at this time patient is very well-appearing and feels comfortable with this plan.   FINAL CLINICAL IMPRESSION(S) / ED DIAGNOSES   Final diagnoses:  Pneumonitis  Other specified diabetes mellitus without complication, without long-term current use of insulin (HCC)     Rx / DC Orders   ED Discharge Orders           Ordered    doxycycline  (VIBRA -TABS) 100 MG tablet  2 times daily        01/03/24 0831    metFORMIN  (GLUCOPHAGE ) 500 MG tablet  2 times daily with meals        01/03/24 0831             Note:  This document was prepared using Dragon voice recognition software and may include unintentional dictation errors.   Ernest Ronal BRAVO, MD 01/03/24 [phone]

## 2024-01-03 NOTE — Discharge Instructions (Addendum)
 Your workup shows signs of hypersensitive pneumonitis related to your dust I suspect.  I prescribed him antibiotics that if things start getting worse with increasing cough, fevers you can start taking.  However it is important that you start your medications for your diabetes and follow-up with a primary care doctor to discuss further treatment for your diabetes. Return to the ER for worsening shortness of breath or any other concerns  IMPRESSION: Scattered mild bilateral peribronchial patchy and interstitial opacity. Consider acute hypersensitivity pneumonitis in this clinical setting, viral/atypical respiratory infection would appear similar.

## 2024-12-09 ENCOUNTER — Other Ambulatory Visit: Payer: Self-pay

## 2024-12-09 ENCOUNTER — Emergency Department
Admission: EM | Admit: 2024-12-09 | Discharge: 2024-12-09 | Disposition: A | Discharge status: Home / Self Care | Payer: Self-pay | Attending: Emergency Medicine | Admitting: Emergency Medicine

## 2024-12-09 ENCOUNTER — Encounter: Payer: Self-pay | Admitting: Emergency Medicine

## 2024-12-09 DIAGNOSIS — R051 Acute cough: Secondary | ICD-10-CM

## 2024-12-09 DIAGNOSIS — A084 Viral intestinal infection, unspecified: Secondary | ICD-10-CM

## 2024-12-09 LAB — RESP PANEL BY RT-PCR (RSV, FLU A&B, COVID)  RVPGX2
Influenza A by PCR: NEGATIVE
Influenza B by PCR: NEGATIVE
Resp Syncytial Virus by PCR: NEGATIVE
SARS Coronavirus 2 by RT PCR: NEGATIVE

## 2024-12-09 NOTE — ED Triage Notes (Signed)
 Pt to ED via POV. Pt states that he has had cough and diarrhea since Saturday morning. Pt states that he is having burning in his chest from the cough. Pts spouse is also sick currently. Pt is in NAD.

## 2024-12-09 NOTE — ED Provider Notes (Signed)
 [facility] Provider Note    Event Date/Time   First MD Initiated Contact with Patient 12/09/24 1508     (approximate)   History   Cough and Diarrhea   HPI  Henry Jackson is a 38 y.o. male with history of asthma presents emergency department with cough, diarrhea since Saturday morning.  States his wife has also been sick.  Had some burning in his chest from the cough.  No known fever but did have some chills.  No vomiting.  No abdominal pain.      Physical Exam   Triage Vital Signs: ED Triage Vitals [12/09/24 1415]  Encounter Vitals Group     BP (!) 158/99     Girls Systolic BP Percentile      Girls Diastolic BP Percentile      Boys Systolic BP Percentile      Boys Diastolic BP Percentile      Pulse Rate 71     Resp 16     Temp 98 F (36.7 C)     Temp Source Oral     SpO2 100 %     Weight 300 lb (136.1 kg)     Height 5' 7 (1.702 m)     Head Circumference      Peak Flow      Pain Score 2     Pain Loc      Pain Education      Exclude from Growth Chart     Most recent vital signs: Vitals:   12/09/24 1415  BP: (!) 158/99  Pulse: 71  Resp: 16  Temp: 98 F (36.7 C)  SpO2: 100%     General: Awake, no distress.   CV:  Good peripheral perfusion. regular rate and  rhythm Resp:  Normal effort. Lungs cta Abd:  No distention.  Nontender Other:      ED Results / Procedures / Treatments   Labs (all labs ordered are listed, but only abnormal results are displayed) Labs Reviewed  RESP PANEL BY RT-PCR (RSV, FLU A&B, COVID)  [id number]     EKG     RADIOLOGY     PROCEDURES:   Procedures  Critical Care:  no Chief Complaint  Patient presents with   Cough   Diarrhea      MEDICATIONS ORDERED IN ED: Medications - No data to display   IMPRESSION / MDM / ASSESSMENT AND PLAN / ED COURSE  I reviewed the triage vital signs and the nursing notes.                              Differential diagnosis includes, but is  not limited to, gastroenteritis, COVID, influenza, RSV, diverticulitis, bowel obstruction, pancreatitis  Patient's presentation is most consistent with acute illness / injury with system symptoms.   Respiratory panel reassuring  Do not feel the patient has diverticulitis, bowel obstruction or pancreatitis as he has a nontender abdomen.  You had a lot of diarrhea in the community so I do feel this is a viral diarrhea.  He is to follow-up with his regular doctor if not improving in 2 to 3 days.  Return emergency department for worsening.  He is in agreement treatment plan.  Discharged stable condition.      FINAL CLINICAL IMPRESSION(S) / ED DIAGNOSES   Final diagnoses:  Viral diarrhea  Acute cough     Rx / DC Orders   ED Discharge Orders  None        Note:  This document was prepared using Dragon voice recognition software and may include unintentional dictation errors.    Gasper Devere ORN, PA-C 12/09/24 1532    Viviann Pastor, MD 12/09/24 2156

## 2024-12-09 NOTE — Discharge Instructions (Signed)
 Follow-up with your regular doctor if not improving in 2 to 3 days.  Return emergency department for worsening. You may use Imodium A-D which is an over-the-counter product for diarrhea Also eat yogurt, bland diet such as bananas rice applesauce toast.  Try to avoid greasy foods as that will start the diarrhea.

## 2024-12-28 ENCOUNTER — Emergency Department: Payer: Self-pay

## 2024-12-28 ENCOUNTER — Other Ambulatory Visit: Payer: Self-pay

## 2024-12-28 ENCOUNTER — Emergency Department
Admission: EM | Admit: 2024-12-28 | Discharge: 2024-12-28 | Disposition: A | Discharge status: Home / Self Care | Payer: Self-pay | Attending: Emergency Medicine | Admitting: Emergency Medicine

## 2024-12-28 DIAGNOSIS — M25561 Pain in right knee: Secondary | ICD-10-CM | POA: Insufficient documentation

## 2024-12-28 DIAGNOSIS — X501XXA Overexertion from prolonged static or awkward postures, initial encounter: Secondary | ICD-10-CM | POA: Insufficient documentation

## 2024-12-28 DIAGNOSIS — Y990 Civilian activity done for income or pay: Secondary | ICD-10-CM | POA: Insufficient documentation

## 2024-12-28 DIAGNOSIS — M25571 Pain in right ankle and joints of right foot: Secondary | ICD-10-CM | POA: Insufficient documentation

## 2024-12-28 NOTE — ED Provider Notes (Signed)
 "  [facility] Provider Note    Event Date/Time   First MD Initiated Contact with Patient 12/28/24 1345     (approximate)   History   Ankle Pain   HPI  Henry Jackson is a 39 y.o. male who presents today for evaluation of right ankle pain and knee pain.  He reports that he rolled his ankle while at work and also hit his knee.  He reports that he did not think much of it at that time, and then a week later he rolled his ankle again at home.  He reports that his work wanted him to get checked out before he returned to work.  He denies any numbness or tingling.  He still able to ambulate.  There are no active problems to display for this patient.         Physical Exam   Triage Vital Signs: ED Triage Vitals  Encounter Vitals Group     BP 12/28/24 1257 (!) 151/97     Girls Systolic BP Percentile --      Girls Diastolic BP Percentile --      Boys Systolic BP Percentile --      Boys Diastolic BP Percentile --      Pulse Rate 12/28/24 1256 72     Resp 12/28/24 1256 18     Temp 12/28/24 1256 99 F (37.2 C)     Temp src --      SpO2 12/28/24 1256 99 %     Weight 12/28/24 1257 300 lb (136.1 kg)     Height 12/28/24 1257 5' 7 (1.702 m)     Head Circumference --      Peak Flow --      Pain Score 12/28/24 1256 4     Pain Loc --      Pain Education --      Exclude from Growth Chart --     Most recent vital signs: Vitals:   12/28/24 1256 12/28/24 1257  BP:  (!) 151/97  Pulse: 72   Resp: 18   Temp: 99 F (37.2 C)   SpO2: 99%     Physical Exam Vitals and nursing note reviewed.  Constitutional:      General: Awake and alert. No acute distress.    Appearance: Normal appearance. The patient is normal weight.  HENT:     Head: Normocephalic and atraumatic.     Mouth: Mucous membranes are moist.  Eyes:     General: PERRL. Normal EOMs        Right eye: No discharge.        Left eye: No discharge.     Conjunctiva/sclera: Conjunctivae normal.   Cardiovascular:     Rate and Rhythm: Normal rate and regular rhythm.     Pulses: Normal pulses.  Pulmonary:     Effort: Pulmonary effort is normal. No respiratory distress.     Breath sounds: Normal breath sounds.  Abdominal:     Abdomen is soft. There is no abdominal tenderness. No rebound or guarding. No distention. Musculoskeletal:        General: No swelling. Normal range of motion.     Cervical back: Normal range of motion and neck supple.  Right ankle: Tenderness and swelling over the anterior talofibular ligament and posterior to lateral malleolus, no lateral or medial malleolar tenderness or proximal fifth metacarpal tenderness. No proximal fibular tenderness. 2+ pedal pulses with brisk capillary refill. Intact distal sensation and strength with normal ROM.  Able to plantar flex and dorsiflex against resistance. Able to invert and evert against resistance. Negative dorsiflexion external rotation test. Negative squeeze test. Negative Thompson test Right knee: No deformity or rash. No joint line tenderness. No patellar tenderness, no ballotment Warm and well perfused extremity with 2+ pedal pulses 5/5 strength to dorsiflexion and plantarflexion at the ankle with intact sensation throughout extremity Normal range of motion of the knee, with intact flexion and extension to active and passive range of motion. Extensor mechanism intact. No ligamentous laxity. Negative anterior/posterior drawer/negative lachman, negative mcmurrays No effusion or warmth Intact quadriceps, hamstring function, patellar tendon function Pelvis stable Full ROM of ankle without pain or swelling Foot warm and well perfused Skin:    General: Skin is warm and dry.     Capillary Refill: Capillary refill takes less than 2 seconds.     Findings: No rash.  Neurological:     Mental Status: The patient is awake and alert.      ED Results / Procedures / Treatments   Labs (all labs ordered are listed, but only  abnormal results are displayed) Labs Reviewed - No data to display   EKG     RADIOLOGY I independently reviewed and interpreted imaging and agree with radiologists findings.     PROCEDURES:  Critical Care performed:   Procedures   MEDICATIONS ORDERED IN ED: Medications - No data to display   IMPRESSION / MDM / ASSESSMENT AND PLAN / ED COURSE  I reviewed the triage vital signs and the nursing notes.   Differential diagnosis includes, but is not limited to, fracture, sprain, contusion.   Patient is awake and alert, hemodynamically stable and afebrile.  Patient has no swelling though mild tenderness to palpation over lateral malleolus and ATFL, as well as to medial joint line of the knee.  No evidence of neurological deficit or vascular compromise on exam.  No deformity or obvious ligamentous laxity on exam.No constitutional symptoms or effusion to suggest septic joint.  X-ray reveals no acute abnormalities.  As for his ankle, there is no evidence of fracture on x-ray.  There is no tenderness to proximal fibula that would be concerning for occult fracture.  There is no proximal fifth metatarsal tenderness concerning for Jones fracture.  Negative squeeze test so do not suspect high ankle sprain.  Negative Thompson test, do not suspect Achilles tendon rupture. Patient is able to bear weight.  Patient was given Ace wrap.  We discussed Rice and outpatient follow-up.  Patient requested a note saying that he can return to work.  He feels that he is able to perform his work duties safely.  We discussed return precautions and outpatient follow-up.  Patient understands and agrees with plan.  Patient's presentation is most consistent with acute complicated illness / injury requiring diagnostic workup.      FINAL CLINICAL IMPRESSION(S) / ED DIAGNOSES   Final diagnoses:  Acute right ankle pain  Acute pain of right knee     Rx / DC Orders   ED Discharge Orders     None         Note:  This document was prepared using Dragon voice recognition software and may include unintentional dictation errors.   Kailie Polus E, PA-C 12/28/24 1435  "

## 2024-12-28 NOTE — ED Triage Notes (Signed)
 Pt comes with right ankle pain and knee pain. Pt states he rolled his ankle and injured it. Pt states he also hit his knee while at work on a metal rack. Pt denies WC

## 2024-12-28 NOTE — Discharge Instructions (Signed)
 You may continue to take Tylenol /ibuprofen  per package instructions to help with your symptoms.  You may wear the Ace wrap for extra support to your ankle.  Please return for any new, worsening, or changing symptoms or other concerns.  It was a pleasure caring for you today.

## 2025-01-29 ENCOUNTER — Emergency Department: Payer: Self-pay

## 2025-01-29 ENCOUNTER — Other Ambulatory Visit: Payer: Self-pay

## 2025-01-29 ENCOUNTER — Emergency Department
Admission: EM | Admit: 2025-01-29 | Discharge: 2025-01-29 | Disposition: A | Discharge status: Home / Self Care | Payer: Self-pay

## 2025-01-29 ENCOUNTER — Encounter: Payer: Self-pay | Admitting: Emergency Medicine

## 2025-01-29 DIAGNOSIS — R519 Headache, unspecified: Secondary | ICD-10-CM | POA: Insufficient documentation

## 2025-01-29 LAB — BASIC METABOLIC PANEL WITH GFR
Anion gap: 9 (ref 5–15)
BUN: 12 mg/dL (ref 6–20)
CO2: 25 mmol/L (ref 22–32)
Calcium: 8.6 mg/dL — ABNORMAL LOW (ref 8.9–10.3)
Chloride: 105 mmol/L (ref 98–111)
Creatinine, Ser: 0.79 mg/dL (ref 0.61–1.24)
GFR, Estimated: >60 mL/min
Glucose, Bld: 185 mg/dL — ABNORMAL HIGH (ref 70–99)
Potassium: 4.5 mmol/L (ref 3.5–5.1)
Sodium: 138 mmol/L (ref 135–145)

## 2025-01-29 LAB — CBC
HCT: 42.3 % (ref 39.0–52.0)
Hemoglobin: 13.5 g/dL (ref 13.0–17.0)
MCH: 27 pg (ref 26.0–34.0)
MCHC: 31.9 g/dL (ref 30.0–36.0)
MCV: 84.6 fL (ref 80.0–100.0)
Platelets: 196 10*3/uL (ref 150–400)
RBC: 5 MIL/uL (ref 4.22–5.81)
RDW: 13.6 % (ref 11.5–15.5)
WBC: 6.7 10*3/uL (ref 4.0–10.5)
nRBC: 0 % (ref 0.0–0.2)

## 2025-01-29 LAB — URINALYSIS, ROUTINE W REFLEX MICROSCOPIC
Bilirubin Urine: NEGATIVE
Glucose, UA: NEGATIVE mg/dL
Hgb urine dipstick: NEGATIVE
Ketones, ur: NEGATIVE mg/dL
Leukocytes,Ua: NEGATIVE
Nitrite: NEGATIVE
Protein, ur: NEGATIVE mg/dL
Specific Gravity, Urine: 1.017 (ref 1.005–1.030)
pH: 6 (ref 5.0–8.0)

## 2025-01-29 LAB — CBG MONITORING, ED: Glucose-Capillary: 207 mg/dL — ABNORMAL HIGH (ref 70–99)

## 2025-01-29 MED ORDER — IBUPROFEN 600 MG PO TABS
600.0000 mg | ORAL_TABLET | Freq: Once | ORAL | Status: AC
  Administered 2025-01-29: 600 mg via ORAL
  Filled 2025-01-29: qty 1

## 2025-01-29 MED ORDER — ACETAMINOPHEN 325 MG PO TABS
650.0000 mg | ORAL_TABLET | Freq: Once | ORAL | Status: AC
  Administered 2025-01-29: 650 mg via ORAL
  Filled 2025-01-29: qty 2

## 2025-01-29 NOTE — ED Notes (Signed)
 See triage note  Presents with some dizziness and headache  States he slipped on ice twice  Hitting his head  No LOC

## 2025-01-29 NOTE — ED Provider Notes (Signed)
 "  [facility] Provider Note    Event Date/Time   First MD Initiated Contact with Patient 01/29/25 [phone]     (approximate)   History   Dizziness   HPI  Henry Jackson is a 39 y.o. male presenting to the emergency department complaining of a headache.  The patient reports that he has been having headaches since slipping and falling on ice a few days ago where he struck the back of his head on the ground.  He reports that yesterday he had dizziness and felt as if his head was foggy all day but this has resolved.  He states that yesterday he also had some nausea which has resolved.  Reports that only the headache remains at this time.  Denies any one-sided weakness/numbness/tingling or vision changes.  He does not take any blood thinners.     Physical Exam   Triage Vital Signs: ED Triage Vitals  Encounter Vitals Group     BP 01/29/25 0920 (!) 151/91     Girls Systolic BP Percentile --      Girls Diastolic BP Percentile --      Boys Systolic BP Percentile --      Boys Diastolic BP Percentile --      Pulse Rate 01/29/25 0920 82     Resp 01/29/25 0920 18     Temp 01/29/25 0920 98.4 F (36.9 C)     Temp Source 01/29/25 0920 Oral     SpO2 01/29/25 0920 98 %     Weight 01/29/25 0919 300 lb (136.1 kg)     Height 01/29/25 0919 5' 7 (1.702 m)     Head Circumference --      Peak Flow --      Pain Score 01/29/25 0919 3     Pain Loc --      Pain Education --      Exclude from Growth Chart --     Most recent vital signs: Vitals:   01/29/25 0920  BP: (!) 151/91  Pulse: 82  Resp: 18  Temp: 98.4 F (36.9 C)  SpO2: 98%     General: Awake, no distress.  CV:  Good peripheral perfusion.  Resp:  Normal effort.  Abd:  No distention.  Other:     ED Results / Procedures / Treatments   Labs (all labs ordered are listed, but only abnormal results are displayed) Labs Reviewed  URINALYSIS, ROUTINE W REFLEX MICROSCOPIC - Abnormal; Notable for the  following components:      Result Value   Color, Urine YELLOW (*)    APPearance CLEAR (*)    All other components within normal limits  BASIC METABOLIC PANEL WITH GFR - Abnormal; Notable for the following components:   Glucose, Bld 185 (*)    Calcium 8.6 (*)    All other components within normal limits  CBG MONITORING, ED - Abnormal; Notable for the following components:   Glucose-Capillary 207 (*)    All other components within normal limits  CBC  CBG MONITORING, ED  CBG MONITORING, ED     EKG  ED ECG REPORT I, [name], the attending physician, personally viewed and interpreted this ECG.  Date: 01/29/2025 Rate: 70 bpm Rhythm: normal sinus rhythm QRS Axis: normal Intervals: normal ST/T Wave abnormalities: normal Narrative Interpretation: no evidence of acute ischemia    RADIOLOGY CT does not show any acute pathology    PROCEDURES:  Critical Care performed: No  Procedures   MEDICATIONS ORDERED  IN ED: Medications  acetaminophen  (TYLENOL ) tablet 650 mg (has no administration in time range)     IMPRESSION / MDM / ASSESSMENT AND PLAN / ED COURSE  I reviewed the triage vital signs and the nursing notes.                              Differential diagnosis includes, but is not limited to, concussion, closed head injury, intracranial hemorrhage, vertigo, migraine  Patient's presentation is most consistent with acute presentation with potential threat to life or bodily function.  Patient is a 39 year old male presenting to the emergency department complaining of a headache.  Patient symptoms treatments Tylenol .  Blood work was performed and was overall unremarkable.  EKG unremarkable.  Head CT was performed given his recent injury was also unremarkable.  Discussed with the patient plan for discharge with instructions to follow-up with his primary care physician within the next few days.  Return to the emergency department for any new or worsening  symptoms.      FINAL CLINICAL IMPRESSION(S) / ED DIAGNOSES   Final diagnoses:  Nonintractable headache, unspecified chronicity pattern, unspecified headache type     Rx / DC Orders   ED Discharge Orders          Ordered    Ambulatory Referral to Primary Care (Establish Care)        01/29/25 1311             Note:  This document was prepared using Dragon voice recognition software and may include unintentional dictation errors.   [name], MD 01/29/25 [phone]  "

## 2025-01-29 NOTE — ED Triage Notes (Signed)
 Pt via POV from home. Pt c/o head pressure, nausea and dizziness that started yesterday that got better throughout the day yesterday, states that this AM he has a headache. Denies an nausea or dizziness at this time. Denies hx of diabetes. Pt is A&Ox4 and NAD
